# Patient Record
Sex: Male | Born: 1940 | Race: White | Hispanic: No | Marital: Married | Smoking: Former smoker
Health system: Southern US, Community
[De-identification: ages and names within clinical notes are randomized; demographics above are authoritative.]

## PROBLEM LIST (undated history)

## (undated) DIAGNOSIS — J986 Disorders of diaphragm: Secondary | ICD-10-CM

## (undated) DIAGNOSIS — T4145XA Adverse effect of unspecified anesthetic, initial encounter: Secondary | ICD-10-CM

## (undated) DIAGNOSIS — F419 Anxiety disorder, unspecified: Secondary | ICD-10-CM

## (undated) DIAGNOSIS — Z95810 Presence of automatic (implantable) cardiac defibrillator: Secondary | ICD-10-CM

## (undated) DIAGNOSIS — T8859XA Other complications of anesthesia, initial encounter: Secondary | ICD-10-CM

## (undated) DIAGNOSIS — W100XXA Fall (on)(from) escalator, initial encounter: Secondary | ICD-10-CM

## (undated) DIAGNOSIS — Z8739 Personal history of other diseases of the musculoskeletal system and connective tissue: Secondary | ICD-10-CM

## (undated) DIAGNOSIS — Z9981 Dependence on supplemental oxygen: Secondary | ICD-10-CM

## (undated) DIAGNOSIS — K259 Gastric ulcer, unspecified as acute or chronic, without hemorrhage or perforation: Secondary | ICD-10-CM

## (undated) DIAGNOSIS — E785 Hyperlipidemia, unspecified: Secondary | ICD-10-CM

## (undated) DIAGNOSIS — I428 Other cardiomyopathies: Secondary | ICD-10-CM

## (undated) DIAGNOSIS — I499 Cardiac arrhythmia, unspecified: Secondary | ICD-10-CM

## (undated) DIAGNOSIS — E039 Hypothyroidism, unspecified: Secondary | ICD-10-CM

## (undated) DIAGNOSIS — K219 Gastro-esophageal reflux disease without esophagitis: Secondary | ICD-10-CM

## (undated) DIAGNOSIS — M1990 Unspecified osteoarthritis, unspecified site: Secondary | ICD-10-CM

## (undated) DIAGNOSIS — I4891 Unspecified atrial fibrillation: Secondary | ICD-10-CM

## (undated) DIAGNOSIS — Z950 Presence of cardiac pacemaker: Secondary | ICD-10-CM

## (undated) DIAGNOSIS — Z9289 Personal history of other medical treatment: Secondary | ICD-10-CM

## (undated) DIAGNOSIS — Z8719 Personal history of other diseases of the digestive system: Secondary | ICD-10-CM

## (undated) DIAGNOSIS — I509 Heart failure, unspecified: Secondary | ICD-10-CM

## (undated) HISTORY — PX: ESOPHAGOGASTRODUODENOSCOPY: SHX1529

## (undated) HISTORY — PX: ATRIAL ABLATION SURGERY: SHX560

## (undated) HISTORY — PX: LAPAROSCOPIC CHOLECYSTECTOMY: SUR755

## (undated) HISTORY — PX: COLONOSCOPY: SHX174

## (undated) HISTORY — PX: TONSILLECTOMY: SUR1361

## (undated) HISTORY — PX: ESOPHAGOGASTRODUODENOSCOPY (EGD) WITH ESOPHAGEAL DILATION: SHX5812

## (undated) HISTORY — PX: CARDIAC CATHETERIZATION: SHX172

## (undated) HISTORY — PX: CATARACT EXTRACTION W/ INTRAOCULAR LENS  IMPLANT, BILATERAL: SHX1307

---

## 1973-10-29 HISTORY — PX: VASECTOMY: SHX75

## 1989-10-29 HISTORY — PX: INSERT / REPLACE / REMOVE PACEMAKER: SUR710

## 2001-10-29 HISTORY — PX: REPLACEMENT UNICONDYLAR JOINT KNEE: SUR1227

## 2013-10-29 HISTORY — PX: BI-VENTRICULAR IMPLANTABLE CARDIOVERTER DEFIBRILLATOR  (CRT-D): SHX5747

## 2014-05-29 DIAGNOSIS — Z9289 Personal history of other medical treatment: Secondary | ICD-10-CM

## 2014-05-29 HISTORY — DX: Personal history of other medical treatment: Z92.89

## 2017-11-06 ENCOUNTER — Observation Stay (HOSPITAL_COMMUNITY)
Admission: EM | Admit: 2017-11-06 | Discharge: 2017-11-08 | Disposition: A | Discharge status: Home / Self Care | Payer: Medicare HMO | Attending: Internal Medicine | Admitting: Internal Medicine

## 2017-11-06 ENCOUNTER — Encounter (HOSPITAL_COMMUNITY): Payer: Self-pay | Admitting: Internal Medicine

## 2017-11-06 ENCOUNTER — Emergency Department (HOSPITAL_COMMUNITY): Payer: Medicare HMO

## 2017-11-06 ENCOUNTER — Other Ambulatory Visit: Payer: Self-pay

## 2017-11-06 DIAGNOSIS — E039 Hypothyroidism, unspecified: Secondary | ICD-10-CM | POA: Diagnosis not present

## 2017-11-06 DIAGNOSIS — T07XXXA Unspecified multiple injuries, initial encounter: Secondary | ICD-10-CM | POA: Diagnosis not present

## 2017-11-06 DIAGNOSIS — S0181XA Laceration without foreign body of other part of head, initial encounter: Secondary | ICD-10-CM | POA: Insufficient documentation

## 2017-11-06 DIAGNOSIS — R079 Chest pain, unspecified: Secondary | ICD-10-CM | POA: Diagnosis not present

## 2017-11-06 DIAGNOSIS — Z7951 Long term (current) use of inhaled steroids: Secondary | ICD-10-CM | POA: Insufficient documentation

## 2017-11-06 DIAGNOSIS — W19XXXA Unspecified fall, initial encounter: Secondary | ICD-10-CM | POA: Diagnosis present

## 2017-11-06 DIAGNOSIS — Z79899 Other long term (current) drug therapy: Secondary | ICD-10-CM | POA: Insufficient documentation

## 2017-11-06 DIAGNOSIS — M25511 Pain in right shoulder: Secondary | ICD-10-CM | POA: Insufficient documentation

## 2017-11-06 DIAGNOSIS — I482 Chronic atrial fibrillation, unspecified: Secondary | ICD-10-CM | POA: Diagnosis present

## 2017-11-06 DIAGNOSIS — E785 Hyperlipidemia, unspecified: Secondary | ICD-10-CM | POA: Diagnosis not present

## 2017-11-06 DIAGNOSIS — R51 Headache: Secondary | ICD-10-CM | POA: Diagnosis present

## 2017-11-06 DIAGNOSIS — S22008A Other fracture of unspecified thoracic vertebra, initial encounter for closed fracture: Secondary | ICD-10-CM | POA: Insufficient documentation

## 2017-11-06 DIAGNOSIS — W100XXA Fall (on)(from) escalator, initial encounter: Secondary | ICD-10-CM | POA: Insufficient documentation

## 2017-11-06 DIAGNOSIS — M542 Cervicalgia: Secondary | ICD-10-CM | POA: Diagnosis not present

## 2017-11-06 DIAGNOSIS — S0281XA Fracture of other specified skull and facial bones, right side, initial encounter for closed fracture: Principal | ICD-10-CM | POA: Insufficient documentation

## 2017-11-06 DIAGNOSIS — Z7901 Long term (current) use of anticoagulants: Secondary | ICD-10-CM | POA: Insufficient documentation

## 2017-11-06 DIAGNOSIS — Z79890 Hormone replacement therapy (postmenopausal): Secondary | ICD-10-CM | POA: Insufficient documentation

## 2017-11-06 DIAGNOSIS — Z882 Allergy status to sulfonamides status: Secondary | ICD-10-CM | POA: Insufficient documentation

## 2017-11-06 DIAGNOSIS — I509 Heart failure, unspecified: Secondary | ICD-10-CM | POA: Diagnosis not present

## 2017-11-06 DIAGNOSIS — Z880 Allergy status to penicillin: Secondary | ICD-10-CM | POA: Insufficient documentation

## 2017-11-06 DIAGNOSIS — Z95810 Presence of automatic (implantable) cardiac defibrillator: Secondary | ICD-10-CM | POA: Insufficient documentation

## 2017-11-06 DIAGNOSIS — S02401A Maxillary fracture, unspecified, initial encounter for closed fracture: Secondary | ICD-10-CM | POA: Diagnosis not present

## 2017-11-06 DIAGNOSIS — S0990XA Unspecified injury of head, initial encounter: Secondary | ICD-10-CM

## 2017-11-06 DIAGNOSIS — I428 Other cardiomyopathies: Secondary | ICD-10-CM

## 2017-11-06 DIAGNOSIS — I429 Cardiomyopathy, unspecified: Secondary | ICD-10-CM | POA: Insufficient documentation

## 2017-11-06 HISTORY — DX: Other complications of anesthesia, initial encounter: T88.59XA

## 2017-11-06 HISTORY — DX: Fall (on)(from) escalator, initial encounter: W10.0XXA

## 2017-11-06 HISTORY — DX: Unspecified atrial fibrillation: I48.91

## 2017-11-06 HISTORY — DX: Personal history of other medical treatment: Z92.89

## 2017-11-06 HISTORY — DX: Anxiety disorder, unspecified: F41.9

## 2017-11-06 HISTORY — DX: Hypothyroidism, unspecified: E03.9

## 2017-11-06 HISTORY — DX: Other cardiomyopathies: I42.8

## 2017-11-06 HISTORY — DX: Presence of automatic (implantable) cardiac defibrillator: Z95.810

## 2017-11-06 HISTORY — DX: Unspecified osteoarthritis, unspecified site: M19.90

## 2017-11-06 HISTORY — DX: Heart failure, unspecified: I50.9

## 2017-11-06 HISTORY — DX: Gastric ulcer, unspecified as acute or chronic, without hemorrhage or perforation: K25.9

## 2017-11-06 HISTORY — DX: Personal history of other diseases of the digestive system: Z87.19

## 2017-11-06 HISTORY — DX: Dependence on supplemental oxygen: Z99.81

## 2017-11-06 HISTORY — DX: Disorders of diaphragm: J98.6

## 2017-11-06 HISTORY — DX: Cardiac arrhythmia, unspecified: I49.9

## 2017-11-06 HISTORY — DX: Personal history of other diseases of the musculoskeletal system and connective tissue: Z87.39

## 2017-11-06 HISTORY — DX: Adverse effect of unspecified anesthetic, initial encounter: T41.45XA

## 2017-11-06 HISTORY — DX: Gastro-esophageal reflux disease without esophagitis: K21.9

## 2017-11-06 HISTORY — DX: Presence of cardiac pacemaker: Z95.0

## 2017-11-06 HISTORY — DX: Hyperlipidemia, unspecified: E78.5

## 2017-11-06 LAB — PROTIME-INR
INR: 2.41
Prothrombin Time: 26 seconds — ABNORMAL HIGH (ref 11.4–15.2)

## 2017-11-06 LAB — COMPREHENSIVE METABOLIC PANEL
ALK PHOS: 72 U/L (ref 38–126)
ALT: 24 U/L (ref 17–63)
AST: 33 U/L (ref 15–41)
Albumin: 3.6 g/dL (ref 3.5–5.0)
Anion gap: 6 (ref 5–15)
BILIRUBIN TOTAL: 0.8 mg/dL (ref 0.3–1.2)
BUN: 20 mg/dL (ref 6–20)
CO2: 29 mmol/L (ref 22–32)
CREATININE: 1.19 mg/dL (ref 0.61–1.24)
Calcium: 8.8 mg/dL — ABNORMAL LOW (ref 8.9–10.3)
Chloride: 103 mmol/L (ref 101–111)
GFR, EST NON AFRICAN AMERICAN: 57 mL/min — AB (ref >60)
Glucose, Bld: 96 mg/dL (ref 65–99)
Potassium: 4.1 mmol/L (ref 3.5–5.1)
Sodium: 138 mmol/L (ref 135–145)
Total Protein: 6.6 g/dL (ref 6.5–8.1)

## 2017-11-06 LAB — CBC
HEMATOCRIT: 37.7 % — AB (ref 39.0–52.0)
HEMOGLOBIN: 11.8 g/dL — AB (ref 13.0–17.0)
MCH: 29.3 pg (ref 26.0–34.0)
MCHC: 31.3 g/dL (ref 30.0–36.0)
MCV: 93.5 fL (ref 78.0–100.0)
PLATELETS: 139 10*3/uL — AB (ref 150–400)
RBC: 4.03 MIL/uL — AB (ref 4.22–5.81)
RDW: 14.4 % (ref 11.5–15.5)
WBC: 6 10*3/uL (ref 4.0–10.5)

## 2017-11-06 LAB — URINALYSIS, ROUTINE W REFLEX MICROSCOPIC
Bacteria, UA: NONE SEEN
Bilirubin Urine: NEGATIVE
Glucose, UA: NEGATIVE mg/dL
Ketones, ur: 5 mg/dL — AB
Leukocytes, UA: NEGATIVE
NITRITE: NEGATIVE
Protein, ur: NEGATIVE mg/dL
SPECIFIC GRAVITY, URINE: 1.014 (ref 1.005–1.030)
Squamous Epithelial / LPF: NONE SEEN
pH: 5 (ref 5.0–8.0)

## 2017-11-06 MED ORDER — AMIODARONE HCL 200 MG PO TABS
200.0000 mg | ORAL_TABLET | Freq: Once | ORAL | Status: AC
  Administered 2017-11-06: 200 mg via ORAL
  Filled 2017-11-06: qty 1

## 2017-11-06 MED ORDER — PANTOPRAZOLE SODIUM 40 MG PO TBEC
40.0000 mg | DELAYED_RELEASE_TABLET | Freq: Two times a day (BID) | ORAL | Status: DC
  Administered 2017-11-07 – 2017-11-08 (×3): 40 mg via ORAL
  Filled 2017-11-06 (×3): qty 1

## 2017-11-06 MED ORDER — METOPROLOL SUCCINATE ER 100 MG PO TB24
100.0000 mg | ORAL_TABLET | Freq: Every evening | ORAL | Status: DC
  Administered 2017-11-07: 100 mg via ORAL
  Filled 2017-11-06 (×2): qty 1

## 2017-11-06 MED ORDER — ONDANSETRON HCL 4 MG/2ML IJ SOLN: 4.0000 mg | Freq: Four times a day (QID) | INTRAMUSCULAR | Status: DC | PRN

## 2017-11-06 MED ORDER — FAMOTIDINE 20 MG PO TABS
20.0000 mg | ORAL_TABLET | Freq: Two times a day (BID) | ORAL | Status: DC
  Administered 2017-11-07 – 2017-11-08 (×3): 20 mg via ORAL
  Filled 2017-11-06 (×3): qty 1

## 2017-11-06 MED ORDER — ONDANSETRON HCL 4 MG PO TABS: 4.0000 mg | ORAL_TABLET | Freq: Four times a day (QID) | ORAL | Status: DC | PRN

## 2017-11-06 MED ORDER — ACETAMINOPHEN 650 MG RE SUPP: 650.0000 mg | Freq: Four times a day (QID) | RECTAL | Status: DC | PRN

## 2017-11-06 MED ORDER — METOPROLOL TARTRATE 25 MG PO TABS
50.0000 mg | ORAL_TABLET | Freq: Once | ORAL | Status: AC
  Administered 2017-11-06: 50 mg via ORAL
  Filled 2017-11-06: qty 2

## 2017-11-06 MED ORDER — FENTANYL CITRATE (PF) 100 MCG/2ML IJ SOLN
50.0000 ug | Freq: Once | INTRAMUSCULAR | Status: AC
Start: 2017-11-06 — End: 2017-11-06
  Administered 2017-11-06: 50 ug via INTRAVENOUS

## 2017-11-06 MED ORDER — TETANUS-DIPHTH-ACELL PERTUSSIS 5-2.5-18.5 LF-MCG/0.5 IM SUSP
0.5000 mL | Freq: Once | INTRAMUSCULAR | Status: AC
  Administered 2017-11-06: 0.5 mL via INTRAMUSCULAR
  Filled 2017-11-06: qty 0.5

## 2017-11-06 MED ORDER — FENTANYL CITRATE (PF) 100 MCG/2ML IJ SOLN
50.0000 ug | Freq: Once | INTRAMUSCULAR | Status: AC
  Administered 2017-11-06: 50 ug via INTRAVENOUS
  Filled 2017-11-06: qty 2

## 2017-11-06 MED ORDER — FENTANYL CITRATE (PF) 100 MCG/2ML IJ SOLN
25.0000 ug | INTRAMUSCULAR | Status: DC | PRN
  Administered 2017-11-07 (×2): 25 ug via INTRAVENOUS
  Filled 2017-11-06 (×2): qty 2

## 2017-11-06 MED ORDER — LORAZEPAM 1 MG PO TABS
2.0000 mg | ORAL_TABLET | Freq: Every day | ORAL | Status: DC
  Administered 2017-11-07 (×2): 2 mg via ORAL
  Filled 2017-11-06 (×2): qty 2

## 2017-11-06 MED ORDER — ESCITALOPRAM OXALATE 10 MG PO TABS
5.0000 mg | ORAL_TABLET | Freq: Every day | ORAL | Status: DC
  Administered 2017-11-07 – 2017-11-08 (×2): 5 mg via ORAL
  Filled 2017-11-06 (×2): qty 1

## 2017-11-06 MED ORDER — AMIODARONE HCL 200 MG PO TABS
200.0000 mg | ORAL_TABLET | Freq: Every evening | ORAL | Status: DC
  Administered 2017-11-07: 200 mg via ORAL
  Filled 2017-11-06: qty 1

## 2017-11-06 MED ORDER — BRIMONIDINE TARTRATE 0.15 % OP SOLN
1.0000 [drp] | Freq: Three times a day (TID) | OPHTHALMIC | Status: DC
  Administered 2017-11-07 – 2017-11-08 (×3): 1 [drp] via OPHTHALMIC
  Filled 2017-11-06 (×2): qty 5

## 2017-11-06 MED ORDER — FENTANYL CITRATE (PF) 100 MCG/2ML IJ SOLN
50.0000 ug | Freq: Once | INTRAMUSCULAR | Status: DC
  Filled 2017-11-06: qty 2

## 2017-11-06 MED ORDER — LEVOTHYROXINE SODIUM 100 MCG PO TABS
100.0000 ug | ORAL_TABLET | Freq: Every day | ORAL | Status: DC
  Administered 2017-11-07 – 2017-11-08 (×2): 100 ug via ORAL
  Filled 2017-11-06 (×2): qty 1

## 2017-11-06 MED ORDER — VITAMIN D 1000 UNITS PO TABS
2000.0000 [IU] | ORAL_TABLET | Freq: Two times a day (BID) | ORAL | Status: DC
  Administered 2017-11-07 – 2017-11-08 (×3): 2000 [IU] via ORAL
  Filled 2017-11-06 (×3): qty 2

## 2017-11-06 MED ORDER — SIMVASTATIN 10 MG PO TABS
10.0000 mg | ORAL_TABLET | Freq: Every day | ORAL | Status: DC
  Administered 2017-11-07: 10 mg via ORAL
  Filled 2017-11-06: qty 1

## 2017-11-06 MED ORDER — ACETAMINOPHEN 325 MG PO TABS
650.0000 mg | ORAL_TABLET | Freq: Four times a day (QID) | ORAL | Status: DC | PRN
  Administered 2017-11-07: 650 mg via ORAL
  Filled 2017-11-06: qty 2

## 2017-11-06 NOTE — ED Notes (Signed)
Small C collar applied

## 2017-11-06 NOTE — H&P (Signed)
History and Physical    Devon Jennings ZOX:[id number] DOB: [birth year] DOA: 11/06/2017  PCP: Rubbie Battiest, MD  [place]  Chief Complaint: Fall.  HPI: Devon Jennings is a 77 y.o. male with history of atrial fibrillation, nonischemic cardiomyopathy status post ICD placement, hypothyroidism, hyperlipidemia had a fall while getting [place] escal[place] the airport.  Patient states he may have lost consciousness not but not sure.  Since the fall patient has been having facial bruise with pain and also extremity pain.  Denies any chest pain abdominal pain nausea vomiting.  ED Course: In the ER patient had CT of the head and C-spine which shows right sided multiple fractures of the facial bones and also possible fracture of the T2 spinous process.  Patient's INR is 2.4.  On-call trauma surgeon Dr. Lindie Spruce has been consulted.  Patient admitted for observation.  Review of Systems: As per HPI, rest all negative.   Past Medical History:  Diagnosis Date  . CHF (congestive heart failure) (HCC)   . Dysrhythmia   . Hypothyroidism     Past Surgical History:  Procedure Laterality Date  . CARDIAC DEFIBRILLATOR PLACEMENT    . CHOLECYSTECTOMY       reports that  has never smoked. he has never used smokeless tobacco. He reports that he does not drink alcohol or use drugs.  Allergies  Allergen Reactions  . Penicillins Hives  . Sulfa Antibiotics Rash    Family History  Problem Relation Age of Onset  . Hypertension Other     Prior to Admission medications   Medication Sig Start Date End Date Taking? Authorizing Provider  amiodarone (PACERONE) 200 MG tablet Take 200 mg by mouth every evening.   Yes [provider]  brimonidine (ALPHAGAN) 0.15 % ophthalmic solution Place 1 drop into the left eye 3 (three) times daily.   Yes [provider]  Cholecalciferol (VITAMIN D3) 2000 units capsule Take 2,000 Units by mouth 2 (two) times daily.   Yes [provider]  escitalopram (LEXAPRO) 5 MG tablet Take 5 mg by mouth daily.   Yes [provider]  fluticasone (FLONASE) 50 MCG/ACT nasal spray Place 1 spray into the nose daily as needed for allergies.    Yes [provider]  hydroxypropyl methylcellulose / hypromellose (ISOPTO TEARS / GONIOVISC) 2.5 % ophthalmic solution Place 1 drop into the right eye 3 (three) times daily.   Yes [provider]  levothyroxine (SYNTHROID, LEVOTHROID) 100 MCG tablet Take 100 mcg by mouth daily.   Yes [provider]  LORazepam (ATIVAN) 1 MG tablet Take 2 mg by mouth at bedtime.   Yes [provider]  metoprolol succinate (TOPROL-XL) 100 MG 24 hr tablet Take 100 mg by mouth every evening.   Yes [provider]  mometasone (ASMANEX) 220 MCG/INH inhaler Inhale 2 puffs into the lungs at bedtime.   Yes [provider]  pantoprazole (PROTONIX) 40 MG tablet Take 40 mg by mouth 2 (two) times daily. 10/09/17  Yes [provider]  ranitidine (ZANTAC) 300 MG tablet Take 300 mg by mouth at bedtime.   Yes [provider]  simvastatin (ZOCOR) 10 MG tablet Take 10 mg by mouth at bedtime.   Yes [provider]  tiotropium (SPIRIVA) 18 MCG inhalation capsule Place 18 mcg into inhaler and inhale at bedtime.    Yes [provider]  warfarin (COUMADIN) 3 MG tablet Take 1.5-3 mg by mouth See admin instructions. Take 1 tablet  on Sunday and Tuesday then take 1/2 tablet all the other days   Yes [provider]    Physical Exam: Vitals:   11/06/17 1915 11/06/17 1943 11/06/17 2000 11/06/17 2247  BP: 129/76 132/85 129/81 123/72  Pulse: 69 70 70 70  Resp: 15 19 14    Temp:      TempSrc:      SpO2: 98% 100% 100%   Weight:      Height:          Constitutional: Moderately built and nourished. Vitals:   11/06/17 1915 11/06/17 1943 11/06/17 2000 11/06/17 2247  BP: 129/76 132/85 129/81 123/72  Pulse: 69 70 70 70  Resp: 15  19 14    Temp:      TempSrc:      SpO2: 98% 100% 100%   Weight:      Height:       Eyes: Anicteric no pallor. ENMT: Right maxillary bruising. Neck: No neck rigidity. Respiratory: No rhonchi or crepitations. Cardiovascular: S1-S2 heard. Abdomen: Soft nontender bowel sounds present. Musculoskeletal: Multiple bruises. Skin: Multiple bruises. Neurologic: Alert awake oriented to time place and person.  Moves all extremities 5 x 5. Psychiatric: Appears normal.  Normal affect.   Labs on Admission: I have personally reviewed following labs and imaging studies  CBC: Recent Labs  Lab 11/06/17 1839  WBC 6.0  HGB 11.8*  HCT 37.7*  MCV 93.5  PLT 139*   Basic Metabolic Panel: Recent Labs  Lab 11/06/17 1839  NA 138  K 4.1  CL 103  CO2 29  GLUCOSE 96  BUN 20  CREATININE 1.19  CALCIUM 8.8*   GFR: Estimated Creatinine Clearance: 69 mL/min (by C-G formula based on SCr of 1.19 mg/dL). Liver Function Tests: Recent Labs  Lab 11/06/17 1839  AST 33  ALT 24  ALKPHOS 72  BILITOT 0.8  PROT 6.6  ALBUMIN 3.6   No results for input(s): LIPASE, AMYLASE in the last 168 hours. No results for input(s): AMMONIA in the last 168 hours. Coagulation Profile: Recent Labs  Lab 11/06/17 1839  INR 2.41   Cardiac Enzymes: No results for input(s): CKTOTAL, CKMB, CKMBINDEX, TROPONINI in the last 168 hours. BNP (last 3 results) No results for input(s): PROBNP in the last 8760 hours. HbA1C: No results for input(s): HGBA1C in the last 72 hours. CBG: No results for input(s): GLUCAP in the last 168 hours. Lipid Profile: No results for input(s): CHOL, HDL, LDLCALC, TRIG, CHOLHDL, LDLDIRECT in the last 72 hours. Thyroid Function Tests: No results for input(s): TSH, T4TOTAL, FREET4, T3FREE, THYROIDAB in the last 72 hours. Anemia Panel: No results for input(s): VITAMINB12, FOLATE, FERRITIN, TIBC, IRON, RETICCTPCT in the last 72 hours. Urine analysis:    Component Value Date/Time    COLORURINE YELLOW 11/06/2017 2116   APPEARANCEUR CLEAR 11/06/2017 2116   LABSPEC 1.014 11/06/2017 2116   PHURINE 5.0 11/06/2017 2116   GLUCOSEU NEGATIVE 11/06/2017 2116   HGBUR LARGE (A) 11/06/2017 2116   BILIRUBINUR NEGATIVE 11/06/2017 2116   KETONESUR 5 (A) 11/06/2017 2116   PROTEINUR NEGATIVE 11/06/2017 2116   NITRITE NEGATIVE 11/06/2017 2116   LEUKOCYTESUR NEGATIVE 11/06/2017 2116   Sepsis Labs: @LABRCNTIP (procalcitonin:4,lacticidven:4) )No results found for this or any previous visit (from the past 240 hour(s)).   Radiological Exams on Admission: Dg Shoulder Right  Result Date: 11/06/2017 CLINICAL DATA:  Right shoulder pain since falling down escalator today. EXAM: RIGHT SHOULDER - 2+ VIEW COMPARISON:  None. FINDINGS: AP and Y-views were obtained. Patient could not  tolerate an axillary view. No evidence of acute fracture or dislocation. The subacromial space is preserved. There are mild acromioclavicular and glenohumeral degenerative changes. Patient has an AICD. IMPRESSION: No acute osseous findings. Electronically Signed   By: Carey Bullocks M.D.   On: 11/06/2017 21:02   Ct Head Wo Contrast  Result Date: 11/06/2017 CLINICAL DATA:  Right-sided head and neck pain after falling down escalator today. Loss of consciousness. EXAM: CT HEAD WITHOUT CONTRAST CT CERVICAL SPINE WITHOUT CONTRAST TECHNIQUE: Multidetector CT imaging of the head and cervical spine was performed following the standard protocol without intravenous contrast. Multiplanar CT image reconstructions of the cervical spine were also generated. COMPARISON:  None. FINDINGS: CT HEAD FINDINGS Brain: There is no evidence of acute intracranial hemorrhage, mass lesion, brain edema or extra-axial fluid collection. The ventricles and subarachnoid spaces are appropriately sized for age. There is no CT evidence of acute cortical infarction. Vascular:  No hyperdense vessel identified. Skull: Negative for fracture or focal lesion.  Sinuses/Orbits: There is right periorbital soft tissue swelling associated with multiple acute right-sided facial fractures. There is a depressed and mildly comminuted fracture of the right zygomatic arch. There are fractures of the right orbital floor and lateral wall. There are fractures of the anterior and lateral walls the right maxilla. No left-sided facial fractures are seen. The globes are intact. There is some soft tissue emphysema in the right malar region. There is mucosal thickening and/or blood in the ethmoid and right maxillary and frontal sinuses. Other: None. CT CERVICAL SPINE FINDINGS Alignment: 2 mm of retrolisthesis at C3-4.  Otherwise normal. Skull base and vertebrae: No evidence of acute cervical spine fracture or traumatic subluxation. However, there is a possible nondisplaced fracture of the T2 spinous process. There is multilevel cervical spondylosis with ankylosis across the C2-3 disc and facet joints. Moderate endplate degenerative changes are present at C3-4. Soft tissues and spinal canal: No prevertebral fluid or swelling. No visible canal hematoma. Disc levels: As above, multilevel spondylosis. There is severe right foraminal narrowing at C3-4 secondary to uncinate spurring and facet hypertrophy. There mild-to-moderate left greater than right foraminal narrowing at C4-5 and C5-6. Upper chest: No acute findings. Other: Carotid atherosclerosis. IMPRESSION: 1. No evidence of acute intracranial or calvarial injury. 2. Multiple right-sided facial fractures (tripod fracture) as described. No evidence of orbital hematoma. There blood in the paranasal sinuses. 3. No evidence of acute cervical spine fracture, traumatic subluxation or static signs of instability. Possible nondisplaced fracture of the T2 spinous process. 4. Multilevel cervical spondylosis. Electronically Signed   By: Carey Bullocks M.D.   On: 11/06/2017 19:46   Ct Cervical Spine Wo Contrast  Result Date: 11/06/2017 CLINICAL  DATA:  Right-sided head and neck pain after falling down escalator today. Loss of consciousness. EXAM: CT HEAD WITHOUT CONTRAST CT CERVICAL SPINE WITHOUT CONTRAST TECHNIQUE: Multidetector CT imaging of the head and cervical spine was performed following the standard protocol without intravenous contrast. Multiplanar CT image reconstructions of the cervical spine were also generated. COMPARISON:  None. FINDINGS: CT HEAD FINDINGS Brain: There is no evidence of acute intracranial hemorrhage, mass lesion, brain edema or extra-axial fluid collection. The ventricles and subarachnoid spaces are appropriately sized for age. There is no CT evidence of acute cortical infarction. Vascular:  No hyperdense vessel identified. Skull: Negative for fracture or focal lesion. Sinuses/Orbits: There is right periorbital soft tissue swelling associated with multiple acute right-sided facial fractures. There is a depressed and mildly comminuted fracture of the right zygomatic arch. There  are fractures of the right orbital floor and lateral wall. There are fractures of the anterior and lateral walls the right maxilla. No left-sided facial fractures are seen. The globes are intact. There is some soft tissue emphysema in the right malar region. There is mucosal thickening and/or blood in the ethmoid and right maxillary and frontal sinuses. Other: None. CT CERVICAL SPINE FINDINGS Alignment: 2 mm of retrolisthesis at C3-4.  Otherwise normal. Skull base and vertebrae: No evidence of acute cervical spine fracture or traumatic subluxation. However, there is a possible nondisplaced fracture of the T2 spinous process. There is multilevel cervical spondylosis with ankylosis across the C2-3 disc and facet joints. Moderate endplate degenerative changes are present at C3-4. Soft tissues and spinal canal: No prevertebral fluid or swelling. No visible canal hematoma. Disc levels: As above, multilevel spondylosis. There is severe right foraminal narrowing  at C3-4 secondary to uncinate spurring and facet hypertrophy. There mild-to-moderate left greater than right foraminal narrowing at C4-5 and C5-6. Upper chest: No acute findings. Other: Carotid atherosclerosis. IMPRESSION: 1. No evidence of acute intracranial or calvarial injury. 2. Multiple right-sided facial fractures (tripod fracture) as described. No evidence of orbital hematoma. There blood in the paranasal sinuses. 3. No evidence of acute cervical spine fracture, traumatic subluxation or static signs of instability. Possible nondisplaced fracture of the T2 spinous process. 4. Multilevel cervical spondylosis. Electronically Signed   By: Carey Bullocks M.D.   On: 11/06/2017 19:46   Dg Chest Portable 1 View  Result Date: 11/06/2017 CLINICAL DATA:  Right-sided chest pain after falling down escalator steps today. Initial encounter. EXAM: PORTABLE CHEST 1 VIEW COMPARISON:  None. FINDINGS: 1841 hr. Left subclavian AICD with multiple leads extending into the right atrium and right ventricle. Some of these are likely remnants of previous pacemakers. The heart size is normal. There is mild aortic and great vessel tortuosity. There is elevation of the right hemidiaphragm with mild right basilar atelectasis. No pneumothorax or significant pleural effusion identified. No acute rib fractures are seen. IMPRESSION: No definite acute findings. Elevated right hemidiaphragm with right basilar atelectasis. Electronically Signed   By: Carey Bullocks M.D.   On: 11/06/2017 19:03    EKG: Independently reviewed.  Normal sinus rhythm with LBBB.  Assessment/Plan Active Problems:   Fall   Atrial fibrillation, chronic (HCC)   NICM (nonischemic cardiomyopathy) (HCC)   Hypothyroidism    1. Fall with facial fractures and possible T2 spinous process fracture -CT of the maxillofacial has been ordered.  Dr. Lazarus Salines of ENT will be seeing patient in consult.  Appreciate trauma surgery consult.  Patient on pain  medications. 2. Atrial fibrillation rate controlled presently in sinus rhythm -on amiodarone and metoprolol.  Coumadin on hold secondary to trauma. 3. Nonischemic cardiomyopathy status post AICD placement. 4. History of hypothyroidism on Synthroid. 5. Normocytic normochromic anemia and thrombocytopenia -follow CBC.   DVT prophylaxis: SCDs and patient is also on Coumadin on hold. Code Status: Full code. Family Communication: Discussed with wife. Disposition Plan: Home. Consults called: Trauma surgery. Admission status: Observation.   Eduard Clos MD [facility] Pager [phone].  If 7PM-7AM, please contact night-coverage [email or web address] Password [facility]  11/06/2017, 11:33 PM

## 2017-11-06 NOTE — ED Triage Notes (Signed)
[place] the [place]. Pt was at the top pf the escalator when he fell down 2/3 of the way down.  He did lose consciousness. Pt presents with pain to his right arm, head, and face with several lacerations. Currently a&ox4. Pt on coumadin for afib.

## 2017-11-06 NOTE — Consult Note (Addendum)
Reason for Consult:Fall down escalator with superficial skin trauma and facial fracture Referring Physician: Avrian Delfavero is an 77 y.o. male.  HPI: This patient [place] the airport [place] returning from a trip to [place].  No LOC.  On coumadin for Afib, and has history of CHF, ventricular tachycardia.  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   No family history on file.  Social History:  has no tobacco, alcohol, and drug history on file.  Allergies:  Allergies  Allergen Reactions  . Penicillins Hives  . Sulfa Antibiotics Rash    Medications: I have reviewed the patient's current medications.  Results for orders placed or performed during the hospital encounter of 11/06/17 (from the past 48 hour(s))  Protime-INR     Status: Abnormal   Collection Time: 11/06/17  6:39 PM  Result Value Ref Range   Prothrombin Time 26.0 (H) 11.4 - 15.2 seconds   INR 2.41   CBC     Status: Abnormal   Collection Time: 11/06/17  6:39 PM  Result Value Ref Range   WBC 6.0 4.0 - 10.5 K/uL   RBC 4.03 (L) 4.22 - 5.81 MIL/uL   Hemoglobin 11.8 (L) 13.0 - 17.0 g/dL   HCT 37.7 (L) 39.0 - 52.0 %   MCV 93.5 78.0 - 100.0 fL   MCH 29.3 26.0 - 34.0 pg   MCHC 31.3 30.0 - 36.0 g/dL   RDW 14.4 11.5 - 15.5 %   Platelets 139 (L) 150 - 400 K/uL  Comprehensive metabolic panel     Status: Abnormal   Collection Time: 11/06/17  6:39 PM  Result Value Ref Range   Sodium 138 135 - 145 mmol/L   Potassium 4.1 3.5 - 5.1 mmol/L   Chloride 103 101 - 111 mmol/L   CO2 29 22 - 32 mmol/L   Glucose, Bld 96 65 - 99 mg/dL   BUN 20 6 - 20 mg/dL   Creatinine, Ser 1.19 0.61 - 1.24 mg/dL   Calcium 8.8 (L) 8.9 - 10.3 mg/dL   Total Protein 6.6 6.5 - 8.1 g/dL   Albumin 3.6 3.5 - 5.0 g/dL   AST 33 15 - 41 U/L   ALT 24 17 - 63 U/L   Alkaline Phosphatase 72 38 - 126 U/L   Total Bilirubin 0.8 0.3 - 1.2 mg/dL   GFR calc non Af Amer 57 (L) >60 mL/min   GFR calc Af Amer >60 >60 mL/min     Comment: (NOTE) The eGFR has been calculated using the CKD EPI equation. This calculation has not been validated in all clinical situations. eGFR's persistently <60 mL/min signify possible Chronic Kidney Disease.    Anion gap 6 5 - 15  Urinalysis, Routine w reflex microscopic     Status: Abnormal   Collection Time: 11/06/17  9:16 PM  Result Value Ref Range   Color, Urine YELLOW YELLOW   APPearance CLEAR CLEAR   Specific Gravity, Urine 1.014 1.005 - 1.030   pH 5.0 5.0 - 8.0   Glucose, UA NEGATIVE NEGATIVE mg/dL   Hgb urine dipstick LARGE (A) NEGATIVE   Bilirubin Urine NEGATIVE NEGATIVE   Ketones, ur 5 (A) NEGATIVE mg/dL   Protein, ur NEGATIVE NEGATIVE mg/dL   Nitrite NEGATIVE NEGATIVE   Leukocytes, UA NEGATIVE NEGATIVE   RBC / HPF 6-30 0 - 5 RBC/hpf   WBC, UA 0-5 0 - 5 WBC/hpf   Bacteria, UA NONE SEEN NONE SEEN  Squamous Epithelial / LPF NONE SEEN NONE SEEN   Mucus PRESENT     Dg Shoulder Right  Result Date: 11/06/2017 CLINICAL DATA:  Right shoulder pain since falling down escalator today. EXAM: RIGHT SHOULDER - 2+ VIEW COMPARISON:  None. FINDINGS: AP and Y-views were obtained. Patient could not tolerate an axillary view. No evidence of acute fracture or dislocation. The subacromial space is preserved. There are mild acromioclavicular and glenohumeral degenerative changes. Patient has an AICD. IMPRESSION: No acute osseous findings. Electronically Signed   By: Richardean Sale M.D.   On: 11/06/2017 21:02   Ct Head Wo Contrast  Result Date: 11/06/2017 CLINICAL DATA:  Right-sided head and neck pain after falling down escalator today. Loss of consciousness. EXAM: CT HEAD WITHOUT CONTRAST CT CERVICAL SPINE WITHOUT CONTRAST TECHNIQUE: Multidetector CT imaging of the head and cervical spine was performed following the standard protocol without intravenous contrast. Multiplanar CT image reconstructions of the cervical spine were also generated. COMPARISON:  None. FINDINGS: CT HEAD FINDINGS  Brain: There is no evidence of acute intracranial hemorrhage, mass lesion, brain edema or extra-axial fluid collection. The ventricles and subarachnoid spaces are appropriately sized for age. There is no CT evidence of acute cortical infarction. Vascular:  No hyperdense vessel identified. Skull: Negative for fracture or focal lesion. Sinuses/Orbits: There is right periorbital soft tissue swelling associated with multiple acute right-sided facial fractures. There is a depressed and mildly comminuted fracture of the right zygomatic arch. There are fractures of the right orbital floor and lateral wall. There are fractures of the anterior and lateral walls the right maxilla. No left-sided facial fractures are seen. The globes are intact. There is some soft tissue emphysema in the right malar region. There is mucosal thickening and/or blood in the ethmoid and right maxillary and frontal sinuses. Other: None. CT CERVICAL SPINE FINDINGS Alignment: 2 mm of retrolisthesis at C3-4.  Otherwise normal. Skull base and vertebrae: No evidence of acute cervical spine fracture or traumatic subluxation. However, there is a possible nondisplaced fracture of the T2 spinous process. There is multilevel cervical spondylosis with ankylosis across the C2-3 disc and facet joints. Moderate endplate degenerative changes are present at C3-4. Soft tissues and spinal canal: No prevertebral fluid or swelling. No visible canal hematoma. Disc levels: As above, multilevel spondylosis. There is severe right foraminal narrowing at C3-4 secondary to uncinate spurring and facet hypertrophy. There mild-to-moderate left greater than right foraminal narrowing at C4-5 and C5-6. Upper chest: No acute findings. Other: Carotid atherosclerosis. IMPRESSION: 1. No evidence of acute intracranial or calvarial injury. 2. Multiple right-sided facial fractures (tripod fracture) as described. No evidence of orbital hematoma. There blood in the paranasal sinuses. 3. No  evidence of acute cervical spine fracture, traumatic subluxation or static signs of instability. Possible nondisplaced fracture of the T2 spinous process. 4. Multilevel cervical spondylosis. Electronically Signed   By: Richardean Sale M.D.   On: 11/06/2017 19:46   Ct Cervical Spine Wo Contrast  Result Date: 11/06/2017 CLINICAL DATA:  Right-sided head and neck pain after falling down escalator today. Loss of consciousness. EXAM: CT HEAD WITHOUT CONTRAST CT CERVICAL SPINE WITHOUT CONTRAST TECHNIQUE: Multidetector CT imaging of the head and cervical spine was performed following the standard protocol without intravenous contrast. Multiplanar CT image reconstructions of the cervical spine were also generated. COMPARISON:  None. FINDINGS: CT HEAD FINDINGS Brain: There is no evidence of acute intracranial hemorrhage, mass lesion, brain edema or extra-axial fluid collection. The ventricles and subarachnoid spaces are appropriately sized for  age. There is no CT evidence of acute cortical infarction. Vascular:  No hyperdense vessel identified. Skull: Negative for fracture or focal lesion. Sinuses/Orbits: There is right periorbital soft tissue swelling associated with multiple acute right-sided facial fractures. There is a depressed and mildly comminuted fracture of the right zygomatic arch. There are fractures of the right orbital floor and lateral wall. There are fractures of the anterior and lateral walls the right maxilla. No left-sided facial fractures are seen. The globes are intact. There is some soft tissue emphysema in the right malar region. There is mucosal thickening and/or blood in the ethmoid and right maxillary and frontal sinuses. Other: None. CT CERVICAL SPINE FINDINGS Alignment: 2 mm of retrolisthesis at C3-4.  Otherwise normal. Skull base and vertebrae: No evidence of acute cervical spine fracture or traumatic subluxation. However, there is a possible nondisplaced fracture of the T2 spinous process.  There is multilevel cervical spondylosis with ankylosis across the C2-3 disc and facet joints. Moderate endplate degenerative changes are present at C3-4. Soft tissues and spinal canal: No prevertebral fluid or swelling. No visible canal hematoma. Disc levels: As above, multilevel spondylosis. There is severe right foraminal narrowing at C3-4 secondary to uncinate spurring and facet hypertrophy. There mild-to-moderate left greater than right foraminal narrowing at C4-5 and C5-6. Upper chest: No acute findings. Other: Carotid atherosclerosis. IMPRESSION: 1. No evidence of acute intracranial or calvarial injury. 2. Multiple right-sided facial fractures (tripod fracture) as described. No evidence of orbital hematoma. There blood in the paranasal sinuses. 3. No evidence of acute cervical spine fracture, traumatic subluxation or static signs of instability. Possible nondisplaced fracture of the T2 spinous process. 4. Multilevel cervical spondylosis. Electronically Signed   By: Richardean Sale M.D.   On: 11/06/2017 19:46   Dg Chest Portable 1 View  Result Date: 11/06/2017 CLINICAL DATA:  Right-sided chest pain after falling down escalator steps today. Initial encounter. EXAM: PORTABLE CHEST 1 VIEW COMPARISON:  None. FINDINGS: 1841 hr. Left subclavian AICD with multiple leads extending into the right atrium and right ventricle. Some of these are likely remnants of previous pacemakers. The heart size is normal. There is mild aortic and great vessel tortuosity. There is elevation of the right hemidiaphragm with mild right basilar atelectasis. No pneumothorax or significant pleural effusion identified. No acute rib fractures are seen. IMPRESSION: No definite acute findings. Elevated right hemidiaphragm with right basilar atelectasis. Electronically Signed   By: Richardean Sale M.D.   On: 11/06/2017 19:03    Review of Systems  Constitutional: Negative.   Eyes: Positive for pain (right periorbital pain.).    Cardiovascular: Negative for chest pain.   Blood pressure 129/81, pulse 70, temperature 97.6 F (36.4 C), temperature source Temporal, resp. rate 14, height 6' 4"  (1.93 m), weight 104.3 kg (230 lb), SpO2 100 %. Physical Exam  Constitutional: He is oriented to person, place, and time. He appears well-developed and well-nourished.  HENT:  Head: Normocephalic.    Eyes: EOM are normal. Pupils are equal, round, and reactive to light.  No right eye entrapment  Neck: Normal range of motion. Neck supple.  Cardiovascular: Normal rate and regular rhythm.  Does not appear to be in Afib currently  Respiratory: Effort normal and breath sounds normal.  GI: Soft. Bowel sounds are normal.  Neurological: He is alert and oriented to person, place, and time. He has normal reflexes.  Skin: Skin is warm and dry.  Psychiatric: He has a normal mood and affect. His behavior is normal. Judgment and  thought content normal.    Assessment/Plan: Fall down escalator with right facial fractures, to be addressed by Dr. Erik Obey in the AM No other injuries. Patient is on coumadin, and INR is therapeutic.  He has no intracranial hemorrhage.  I do not believe that his coumadin needs to be actively reversed, but I would not give him his anticoagulant  Possible nondisplaced T-2 spinous process fracture--no further workup necessary  There is little for the trauma service to offer in this gentleman with MMP including significant heart disease.  We will consult as will Dr. Erik Obey, but needs primary care/hospitalist admission.  We will continue to follow.  Patient takes amiodarone and Lopressor in the evening and I will order those doses.  Judeth Horn 11/06/2017, 10:23 PM

## 2017-11-06 NOTE — ED Provider Notes (Signed)
[facility] EMERGENCY DEPARTMENT Provider Note   CSN: [id number] Arrival date & time: 11/06/17  1824     History   Chief Complaint Chief Complaint  Patient presents with  . Fall  . Head Injury    HPI Devon Jennings is a 77 y.o. male.  HPI Patient with history of atrial fibrillation on Coumadin states he fell down escalator.  No loss of consciousness.  Sustained multiple superficial lacerations to the scalp and face as well as the right lower extremity.  He complains of right-sided facial pain, neck pain and right shoulder pain.  No focal weakness or numbness. No past medical history on file.  Patient Active Problem List   Diagnosis Date Noted  . Fall 11/06/2017         Home Medications    Prior to Admission medications   Not on File    Family History No family history on file.  Social History Social History   Tobacco Use  . Smoking status: Not on file  Substance Use Topics  . Alcohol use: Not on file  . Drug use: Not on file     Allergies   Penicillins and Sulfa antibiotics   Review of Systems Review of Systems  Constitutional: Negative for chills and fever.  HENT: Positive for facial swelling.   Eyes: Negative for visual disturbance.  Respiratory: Negative for cough and shortness of breath.   Cardiovascular: Negative for chest pain.  Gastrointestinal: Negative for abdominal pain, constipation, diarrhea, nausea and vomiting.  Musculoskeletal: Positive for arthralgias, myalgias and neck pain. Negative for neck stiffness.  Skin: Positive for wound.  Neurological: Positive for headaches. Negative for dizziness, syncope, weakness, light-headedness and numbness.     Physical Exam Updated Vital Signs BP 129/81   Pulse 70   Temp 97.6 F (36.4 C) (Temporal)   Resp 14   Ht 6\' 4"  (1.93 m)   Wt 104.3 kg (230 lb)   SpO2 100%   BMI 28.00 kg/m   Physical Exam  Constitutional: He is oriented to person, place, and time. He appears  well-developed and well-nourished.  HENT:  Head: Normocephalic and atraumatic.  Mouth/Throat: Oropharynx is clear and moist.  Right periorbital edema.  Has tenderness to palpation over the right maxillary sinus.  No malocclusion.  No intraoral trauma.  Multiple superficial abrasions to the right side of the face and scalp  Eyes: EOM are normal. Pupils are equal, round, and reactive to light.  No evidence of entrapment.  No hyphema.  Pupils are round.  Neck: Normal range of motion. Neck supple.  Tenderness to palpation over the midline spinous process of the lower cervical/upper thoracic spine.  Cervical collar in place.  Cardiovascular: Normal rate and regular rhythm.  Pulmonary/Chest: Effort normal and breath sounds normal. No stridor. No respiratory distress. He has no wheezes. He has no rales. He exhibits no tenderness.  Abdominal: Soft. Bowel sounds are normal. There is no tenderness. There is no rebound and no guarding.  Musculoskeletal: Normal range of motion. He exhibits tenderness. He exhibits no edema.  Patient has tenderness to palpation over the right trapezius and right deltoid.  Pain with range of motion of the right shoulder.  Distal pulses are 2+.  No midline thoracic or lumbar tenderness.  Pelvis stable.  Full range of motion of bilateral hips.  Distal pulses are 2+.  Neurological: He is alert and oriented to person, place, and time.  5/5 motor in all extremities.  Sensation fully intact.  Skin:  Skin is warm and dry. Capillary refill takes less than 2 seconds. No rash noted. No erythema.  Psychiatric: He has a normal mood and affect. His behavior is normal.  Nursing note and vitals reviewed.    ED Treatments / Results  Labs (all labs ordered are listed, but only abnormal results are displayed) Labs Reviewed  PROTIME-INR - Abnormal; Notable for the following components:      Result Value   Prothrombin Time 26.0 (*)    All other components within normal limits  CBC -  Abnormal; Notable for the following components:   RBC 4.03 (*)    Hemoglobin 11.8 (*)    HCT 37.7 (*)    Platelets 139 (*)    All other components within normal limits  COMPREHENSIVE METABOLIC PANEL - Abnormal; Notable for the following components:   Calcium 8.8 (*)    GFR calc non Af Amer 57 (*)    All other components within normal limits  URINALYSIS, ROUTINE W REFLEX MICROSCOPIC - Abnormal; Notable for the following components:   Hgb urine dipstick LARGE (*)    Ketones, ur 5 (*)    All other components within normal limits    EKG  EKG Interpretation None       Radiology Dg Shoulder Right  Result Date: 11/06/2017 CLINICAL DATA:  Right shoulder pain since falling down escalator today. EXAM: RIGHT SHOULDER - 2+ VIEW COMPARISON:  None. FINDINGS: AP and Y-views were obtained. Patient could not tolerate an axillary view. No evidence of acute fracture or dislocation. The subacromial space is preserved. There are mild acromioclavicular and glenohumeral degenerative changes. Patient has an AICD. IMPRESSION: No acute osseous findings. Electronically Signed   By: Carey Bullocks M.D.   On: 11/06/2017 21:02   Ct Head Wo Contrast  Result Date: 11/06/2017 CLINICAL DATA:  Right-sided head and neck pain after falling down escalator today. Loss of consciousness. EXAM: CT HEAD WITHOUT CONTRAST CT CERVICAL SPINE WITHOUT CONTRAST TECHNIQUE: Multidetector CT imaging of the head and cervical spine was performed following the standard protocol without intravenous contrast. Multiplanar CT image reconstructions of the cervical spine were also generated. COMPARISON:  None. FINDINGS: CT HEAD FINDINGS Brain: There is no evidence of acute intracranial hemorrhage, mass lesion, brain edema or extra-axial fluid collection. The ventricles and subarachnoid spaces are appropriately sized for age. There is no CT evidence of acute cortical infarction. Vascular:  No hyperdense vessel identified. Skull: Negative for  fracture or focal lesion. Sinuses/Orbits: There is right periorbital soft tissue swelling associated with multiple acute right-sided facial fractures. There is a depressed and mildly comminuted fracture of the right zygomatic arch. There are fractures of the right orbital floor and lateral wall. There are fractures of the anterior and lateral walls the right maxilla. No left-sided facial fractures are seen. The globes are intact. There is some soft tissue emphysema in the right malar region. There is mucosal thickening and/or blood in the ethmoid and right maxillary and frontal sinuses. Other: None. CT CERVICAL SPINE FINDINGS Alignment: 2 mm of retrolisthesis at C3-4.  Otherwise normal. Skull base and vertebrae: No evidence of acute cervical spine fracture or traumatic subluxation. However, there is a possible nondisplaced fracture of the T2 spinous process. There is multilevel cervical spondylosis with ankylosis across the C2-3 disc and facet joints. Moderate endplate degenerative changes are present at C3-4. Soft tissues and spinal canal: No prevertebral fluid or swelling. No visible canal hematoma. Disc levels: As above, multilevel spondylosis. There is severe right foraminal narrowing  at C3-4 secondary to uncinate spurring and facet hypertrophy. There mild-to-moderate left greater than right foraminal narrowing at C4-5 and C5-6. Upper chest: No acute findings. Other: Carotid atherosclerosis. IMPRESSION: 1. No evidence of acute intracranial or calvarial injury. 2. Multiple right-sided facial fractures (tripod fracture) as described. No evidence of orbital hematoma. There blood in the paranasal sinuses. 3. No evidence of acute cervical spine fracture, traumatic subluxation or static signs of instability. Possible nondisplaced fracture of the T2 spinous process. 4. Multilevel cervical spondylosis. Electronically Signed   By: Carey Bullocks M.D.   On: 11/06/2017 19:46   Ct Cervical Spine Wo Contrast  Result  Date: 11/06/2017 CLINICAL DATA:  Right-sided head and neck pain after falling down escalator today. Loss of consciousness. EXAM: CT HEAD WITHOUT CONTRAST CT CERVICAL SPINE WITHOUT CONTRAST TECHNIQUE: Multidetector CT imaging of the head and cervical spine was performed following the standard protocol without intravenous contrast. Multiplanar CT image reconstructions of the cervical spine were also generated. COMPARISON:  None. FINDINGS: CT HEAD FINDINGS Brain: There is no evidence of acute intracranial hemorrhage, mass lesion, brain edema or extra-axial fluid collection. The ventricles and subarachnoid spaces are appropriately sized for age. There is no CT evidence of acute cortical infarction. Vascular:  No hyperdense vessel identified. Skull: Negative for fracture or focal lesion. Sinuses/Orbits: There is right periorbital soft tissue swelling associated with multiple acute right-sided facial fractures. There is a depressed and mildly comminuted fracture of the right zygomatic arch. There are fractures of the right orbital floor and lateral wall. There are fractures of the anterior and lateral walls the right maxilla. No left-sided facial fractures are seen. The globes are intact. There is some soft tissue emphysema in the right malar region. There is mucosal thickening and/or blood in the ethmoid and right maxillary and frontal sinuses. Other: None. CT CERVICAL SPINE FINDINGS Alignment: 2 mm of retrolisthesis at C3-4.  Otherwise normal. Skull base and vertebrae: No evidence of acute cervical spine fracture or traumatic subluxation. However, there is a possible nondisplaced fracture of the T2 spinous process. There is multilevel cervical spondylosis with ankylosis across the C2-3 disc and facet joints. Moderate endplate degenerative changes are present at C3-4. Soft tissues and spinal canal: No prevertebral fluid or swelling. No visible canal hematoma. Disc levels: As above, multilevel spondylosis. There is severe  right foraminal narrowing at C3-4 secondary to uncinate spurring and facet hypertrophy. There mild-to-moderate left greater than right foraminal narrowing at C4-5 and C5-6. Upper chest: No acute findings. Other: Carotid atherosclerosis. IMPRESSION: 1. No evidence of acute intracranial or calvarial injury. 2. Multiple right-sided facial fractures (tripod fracture) as described. No evidence of orbital hematoma. There blood in the paranasal sinuses. 3. No evidence of acute cervical spine fracture, traumatic subluxation or static signs of instability. Possible nondisplaced fracture of the T2 spinous process. 4. Multilevel cervical spondylosis. Electronically Signed   By: Carey Bullocks M.D.   On: 11/06/2017 19:46   Dg Chest Portable 1 View  Result Date: 11/06/2017 CLINICAL DATA:  Right-sided chest pain after falling down escalator steps today. Initial encounter. EXAM: PORTABLE CHEST 1 VIEW COMPARISON:  None. FINDINGS: 1841 hr. Left subclavian AICD with multiple leads extending into the right atrium and right ventricle. Some of these are likely remnants of previous pacemakers. The heart size is normal. There is mild aortic and great vessel tortuosity. There is elevation of the right hemidiaphragm with mild right basilar atelectasis. No pneumothorax or significant pleural effusion identified. No acute rib fractures are seen. IMPRESSION:  No definite acute findings. Elevated right hemidiaphragm with right basilar atelectasis. Electronically Signed   By: Carey Bullocks M.D.   On: 11/06/2017 19:03    Procedures Procedures (including critical care time)  Medications Ordered in ED Medications  fentaNYL (SUBLIMAZE) injection 50 mcg (not administered)  amiodarone (PACERONE) tablet 200 mg (not administered)  metoprolol tartrate (LOPRESSOR) tablet 50 mg (not administered)  fentaNYL (SUBLIMAZE) injection 50 mcg (50 mcg Intravenous Given 11/06/17 1844)  fentaNYL (SUBLIMAZE) injection 50 mcg (50 mcg Intravenous Given  11/06/17 2005)  Tdap (BOOSTRIX) injection 0.5 mL (0.5 mLs Intramuscular Given 11/06/17 2108)     Initial Impression / Assessment and Plan / ED Course  I have reviewed the triage vital signs and the nursing notes.  Pertinent labs & imaging results that were available during my care of the patient were reviewed by me and considered in my medical decision making (see chart for details).    Lacerations are superficial and do not need surgical repair. Discussed with Dr. Lazarus Salines who will consult on patient regarding his facial fractures.  Dr. Lindie Spruce will see patient in the emergency department.  Hospitalist to admit. Final Clinical Impressions(s) / ED Diagnoses   Final diagnoses:  Closed head injury, initial encounter  Closed fracture of maxilla, unspecified laterality, initial encounter (HCC)  Closed fracture of spinous process of thoracic vertebra, initial encounter [facility])  Multiple lacerations    ED Discharge Orders    None       Loren Racer, MD 11/06/17 2242

## 2017-11-07 ENCOUNTER — Encounter (HOSPITAL_COMMUNITY): Payer: Self-pay | Admitting: General Practice

## 2017-11-07 ENCOUNTER — Observation Stay (HOSPITAL_COMMUNITY): Payer: Medicare HMO

## 2017-11-07 ENCOUNTER — Other Ambulatory Visit: Payer: Self-pay

## 2017-11-07 DIAGNOSIS — S02401A Maxillary fracture, unspecified, initial encounter for closed fracture: Secondary | ICD-10-CM | POA: Diagnosis not present

## 2017-11-07 DIAGNOSIS — I482 Chronic atrial fibrillation: Secondary | ICD-10-CM | POA: Diagnosis not present

## 2017-11-07 DIAGNOSIS — S22008A Other fracture of unspecified thoracic vertebra, initial encounter for closed fracture: Secondary | ICD-10-CM | POA: Diagnosis not present

## 2017-11-07 LAB — CBC
HCT: 34.8 % — ABNORMAL LOW (ref 39.0–52.0)
Hemoglobin: 11 g/dL — ABNORMAL LOW (ref 13.0–17.0)
MCH: 29.7 pg (ref 26.0–34.0)
MCHC: 31.6 g/dL (ref 30.0–36.0)
MCV: 94.1 fL (ref 78.0–100.0)
Platelets: 126 10*3/uL — ABNORMAL LOW (ref 150–400)
RBC: 3.7 MIL/uL — AB (ref 4.22–5.81)
RDW: 14.6 % (ref 11.5–15.5)
WBC: 5.6 10*3/uL (ref 4.0–10.5)

## 2017-11-07 LAB — BASIC METABOLIC PANEL
Anion gap: 7 (ref 5–15)
BUN: 16 mg/dL (ref 6–20)
CO2: 30 mmol/L (ref 22–32)
CREATININE: 0.99 mg/dL (ref 0.61–1.24)
Calcium: 8.4 mg/dL — ABNORMAL LOW (ref 8.9–10.3)
Chloride: 101 mmol/L (ref 101–111)
GFR calc Af Amer: >60 mL/min (ref >60)
Glucose, Bld: 122 mg/dL — ABNORMAL HIGH (ref 65–99)
POTASSIUM: 3.8 mmol/L (ref 3.5–5.1)
SODIUM: 138 mmol/L (ref 135–145)

## 2017-11-07 MED ORDER — BISACODYL 10 MG RE SUPP: 10.0000 mg | Freq: Every day | RECTAL | Status: DC | PRN

## 2017-11-07 MED ORDER — CEPHALEXIN 250 MG PO CAPS
250.0000 mg | ORAL_CAPSULE | Freq: Four times a day (QID) | ORAL | Status: DC
Start: 2017-11-07 — End: 2017-11-08
  Administered 2017-11-07 – 2017-11-08 (×3): 250 mg via ORAL
  Filled 2017-11-07 (×5): qty 1

## 2017-11-07 MED ORDER — ENSURE ENLIVE PO LIQD: 237.0000 mL | Freq: Two times a day (BID) | ORAL | Status: DC

## 2017-11-07 MED ORDER — POLYETHYLENE GLYCOL 3350 17 G PO PACK
17.0000 g | PACK | Freq: Every day | ORAL | Status: DC
  Administered 2017-11-07 – 2017-11-08 (×2): 17 g via ORAL
  Filled 2017-11-07 (×2): qty 1

## 2017-11-07 MED ORDER — FLEET ENEMA 7-19 GM/118ML RE ENEM: 1.0000 | ENEMA | Freq: Every day | RECTAL | Status: DC | PRN

## 2017-11-07 MED ORDER — OXYCODONE HCL 5 MG PO TABS
5.0000 mg | ORAL_TABLET | ORAL | Status: DC | PRN
  Administered 2017-11-07 – 2017-11-08 (×3): 10 mg via ORAL
  Filled 2017-11-07 (×3): qty 2

## 2017-11-07 MED ORDER — DOCUSATE SODIUM 100 MG PO CAPS
100.0000 mg | ORAL_CAPSULE | Freq: Two times a day (BID) | ORAL | Status: DC
  Administered 2017-11-07 – 2017-11-08 (×3): 100 mg via ORAL
  Filled 2017-11-07 (×3): qty 1

## 2017-11-07 NOTE — Progress Notes (Signed)
Received report on pt.

## 2017-11-07 NOTE — Progress Notes (Signed)
[facility] Progress Note     Subjective: CC: pain in R face Patient complaining mostly of pain in R face, and also reports mild pain in R shoulder and upper back. Pain well controlled with medications. Reports he ate breakfast this AM and did have increased pain with trying to chew food. Reports hx of chronic constipation and has some mild abdominal pain that he associates with this. Denies SOB, chest pain, nausea, blurred vision, confusion. Patient would like to try to get up, states he uses a walker at home. VSS.   Objective: Vital signs in last 24 hours: Temp:  [97.6 F (36.4 C)] 97.6 F (36.4 C) (01/09 1834) Pulse Rate:  [69-70] 70 (01/10 0830) Resp:  [12-19] 14 (01/10 0830) BP: (91-133)/(62-87) 95/63 (01/10 0800) SpO2:  [94 %-100 %] 99 % (01/10 0830) Weight:  [104.3 kg (230 lb)] 104.3 kg (230 lb) (01/09 1846)    Intake/Output from previous day: 01/09 0701 - 01/10 0700 In: -  Out: 350 [Urine:350] Intake/Output this shift: No intake/output data recorded.  Physical Exam  Constitutional: He is oriented to person, place, and time. He appears well-developed and well-nourished. No distress.  HENT:  Head: Head is with abrasion and with right periorbital erythema. Head is without Battle's sign.  Left Ear: External ear normal.  Nose: Nose normal.  Abrasions to the right cheek, right ear   Eyes: Conjunctivae, EOM and lids are normal. Pupils are equal, round, and reactive to light. No scleral icterus.  Neck: Trachea normal, normal range of motion and full passive range of motion without pain. Neck supple. No spinous process tenderness present.  Cardiovascular: Normal rate, regular rhythm and S1 normal.  Respiratory: Effort normal and breath sounds normal. No respiratory distress. He has no decreased breath sounds.  GI: Soft. Normal appearance. He exhibits no distension. Bowel sounds are decreased. There is no tenderness. There is no rigidity, no rebound and no guarding.   Musculoskeletal:       Right shoulder: He exhibits tenderness. He exhibits no bony tenderness, no swelling and no deformity.       Thoracic back: He exhibits bony tenderness (T2). He exhibits no deformity.  ROM grossly intact in right shoulder Pain with Empty Can test on the right shoulder  Neurological: He is alert and oriented to person, place, and time. He has normal strength. No cranial nerve deficit.  Skin: Skin is warm and dry. Abrasion noted.  Abrasions to the left hand, dressing in place   Psychiatric: He has a normal mood and affect. His speech is normal and behavior is normal.    Lab Results:  Recent Labs    11/06/17 1839 11/07/17 0511  WBC 6.0 5.6  HGB 11.8* 11.0*  HCT 37.7* 34.8*  PLT 139* 126*   BMET Recent Labs    11/06/17 1839 11/07/17 0511  NA 138 138  K 4.1 3.8  CL 103 101  CO2 29 30  GLUCOSE 96 122*  BUN 20 16  CREATININE 1.19 0.99  CALCIUM 8.8* 8.4*   PT/INR Recent Labs    11/06/17 1839  LABPROT 26.0*  INR 2.41   CMP     Component Value Date/Time   NA 138 11/07/2017 0511   K 3.8 11/07/2017 0511   CL 101 11/07/2017 0511   CO2 30 11/07/2017 0511   GLUCOSE 122 (H) 11/07/2017 0511   BUN 16 11/07/2017 0511   CREATININE 0.99 11/07/2017 0511   CALCIUM 8.4 (L) 11/07/2017 0511   PROT 6.6 11/06/2017  1839   ALBUMIN 3.6 11/06/2017 1839   AST 33 11/06/2017 1839   ALT 24 11/06/2017 1839   ALKPHOS 72 11/06/2017 1839   BILITOT 0.8 11/06/2017 1839   GFRNONAA >60 11/07/2017 0511   GFRAA >60 11/07/2017 0511   Lipase  No results found for: LIPASE     Studies/Results: Dg Shoulder Right  Result Date: 11/06/2017 CLINICAL DATA:  Right shoulder pain since falling down escalator today. EXAM: RIGHT SHOULDER - 2+ VIEW COMPARISON:  None. FINDINGS: AP and Y-views were obtained. Patient could not tolerate an axillary view. No evidence of acute fracture or dislocation. The subacromial space is preserved. There are mild acromioclavicular and glenohumeral  degenerative changes. Patient has an AICD. IMPRESSION: No acute osseous findings. Electronically Signed   By: Carey Bullocks M.D.   On: 11/06/2017 21:02   Ct Head Wo Contrast  Result Date: 11/06/2017 CLINICAL DATA:  Right-sided head and neck pain after falling down escalator today. Loss of consciousness. EXAM: CT HEAD WITHOUT CONTRAST CT CERVICAL SPINE WITHOUT CONTRAST TECHNIQUE: Multidetector CT imaging of the head and cervical spine was performed following the standard protocol without intravenous contrast. Multiplanar CT image reconstructions of the cervical spine were also generated. COMPARISON:  None. FINDINGS: CT HEAD FINDINGS Brain: There is no evidence of acute intracranial hemorrhage, mass lesion, brain edema or extra-axial fluid collection. The ventricles and subarachnoid spaces are appropriately sized for age. There is no CT evidence of acute cortical infarction. Vascular:  No hyperdense vessel identified. Skull: Negative for fracture or focal lesion. Sinuses/Orbits: There is right periorbital soft tissue swelling associated with multiple acute right-sided facial fractures. There is a depressed and mildly comminuted fracture of the right zygomatic arch. There are fractures of the right orbital floor and lateral wall. There are fractures of the anterior and lateral walls the right maxilla. No left-sided facial fractures are seen. The globes are intact. There is some soft tissue emphysema in the right malar region. There is mucosal thickening and/or blood in the ethmoid and right maxillary and frontal sinuses. Other: None. CT CERVICAL SPINE FINDINGS Alignment: 2 mm of retrolisthesis at C3-4.  Otherwise normal. Skull base and vertebrae: No evidence of acute cervical spine fracture or traumatic subluxation. However, there is a possible nondisplaced fracture of the T2 spinous process. There is multilevel cervical spondylosis with ankylosis across the C2-3 disc and facet joints. Moderate endplate  degenerative changes are present at C3-4. Soft tissues and spinal canal: No prevertebral fluid or swelling. No visible canal hematoma. Disc levels: As above, multilevel spondylosis. There is severe right foraminal narrowing at C3-4 secondary to uncinate spurring and facet hypertrophy. There mild-to-moderate left greater than right foraminal narrowing at C4-5 and C5-6. Upper chest: No acute findings. Other: Carotid atherosclerosis. IMPRESSION: 1. No evidence of acute intracranial or calvarial injury. 2. Multiple right-sided facial fractures (tripod fracture) as described. No evidence of orbital hematoma. There blood in the paranasal sinuses. 3. No evidence of acute cervical spine fracture, traumatic subluxation or static signs of instability. Possible nondisplaced fracture of the T2 spinous process. 4. Multilevel cervical spondylosis. Electronically Signed   By: Carey Bullocks M.D.   On: 11/06/2017 19:46   Ct Cervical Spine Wo Contrast  Result Date: 11/06/2017 CLINICAL DATA:  Right-sided head and neck pain after falling down escalator today. Loss of consciousness. EXAM: CT HEAD WITHOUT CONTRAST CT CERVICAL SPINE WITHOUT CONTRAST TECHNIQUE: Multidetector CT imaging of the head and cervical spine was performed following the standard protocol without intravenous contrast. Multiplanar CT image  reconstructions of the cervical spine were also generated. COMPARISON:  None. FINDINGS: CT HEAD FINDINGS Brain: There is no evidence of acute intracranial hemorrhage, mass lesion, brain edema or extra-axial fluid collection. The ventricles and subarachnoid spaces are appropriately sized for age. There is no CT evidence of acute cortical infarction. Vascular:  No hyperdense vessel identified. Skull: Negative for fracture or focal lesion. Sinuses/Orbits: There is right periorbital soft tissue swelling associated with multiple acute right-sided facial fractures. There is a depressed and mildly comminuted fracture of the right  zygomatic arch. There are fractures of the right orbital floor and lateral wall. There are fractures of the anterior and lateral walls the right maxilla. No left-sided facial fractures are seen. The globes are intact. There is some soft tissue emphysema in the right malar region. There is mucosal thickening and/or blood in the ethmoid and right maxillary and frontal sinuses. Other: None. CT CERVICAL SPINE FINDINGS Alignment: 2 mm of retrolisthesis at C3-4.  Otherwise normal. Skull base and vertebrae: No evidence of acute cervical spine fracture or traumatic subluxation. However, there is a possible nondisplaced fracture of the T2 spinous process. There is multilevel cervical spondylosis with ankylosis across the C2-3 disc and facet joints. Moderate endplate degenerative changes are present at C3-4. Soft tissues and spinal canal: No prevertebral fluid or swelling. No visible canal hematoma. Disc levels: As above, multilevel spondylosis. There is severe right foraminal narrowing at C3-4 secondary to uncinate spurring and facet hypertrophy. There mild-to-moderate left greater than right foraminal narrowing at C4-5 and C5-6. Upper chest: No acute findings. Other: Carotid atherosclerosis. IMPRESSION: 1. No evidence of acute intracranial or calvarial injury. 2. Multiple right-sided facial fractures (tripod fracture) as described. No evidence of orbital hematoma. There blood in the paranasal sinuses. 3. No evidence of acute cervical spine fracture, traumatic subluxation or static signs of instability. Possible nondisplaced fracture of the T2 spinous process. 4. Multilevel cervical spondylosis. Electronically Signed   By: Carey Bullocks M.D.   On: 11/06/2017 19:46   Dg Chest Portable 1 View  Result Date: 11/06/2017 CLINICAL DATA:  Right-sided chest pain after falling down escalator steps today. Initial encounter. EXAM: PORTABLE CHEST 1 VIEW COMPARISON:  None. FINDINGS: 1841 hr. Left subclavian AICD with multiple leads  extending into the right atrium and right ventricle. Some of these are likely remnants of previous pacemakers. The heart size is normal. There is mild aortic and great vessel tortuosity. There is elevation of the right hemidiaphragm with mild right basilar atelectasis. No pneumothorax or significant pleural effusion identified. No acute rib fractures are seen. IMPRESSION: No definite acute findings. Elevated right hemidiaphragm with right basilar atelectasis. Electronically Signed   By: Carey Bullocks M.D.   On: 11/06/2017 19:03    Anti-infectives: Anti-infectives (From admission, onward)   None       Assessment/Plan Fall R zygomatic fx/R orbital floor fx/ R maxilla fx - CT maxillofacial pending, Dr. Lazarus Salines to assess today  R periorbital swelling - ice prn, Dr. 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

## 2017-11-07 NOTE — Progress Notes (Addendum)
PROGRESS NOTE    Unique Searfoss  ZOX:[id number] DOB: [birth year] DOA: 11/06/2017 PCP: Rubbie Battiest, MD   Brief Narrative: Patient is a 77 year old male with past medical history of atrial fibrillation, nonischemic cardiomyopathy status post ICD placement, hypothyroidism, hyperlipidemia who [place].  Patient states he might have lost consciousness but not sure.  He immediately developed facial bruise with pain and also lower extremity pain.  In the emergency department he underwent CT of the head and C-spine which was right-sided multiple fracture of facial bones and also possible fracture of T2 spinous process. Patient was evaluated by trauma surgery and waiting for evaluation by ENT.  Assessment & Plan:   Principal Problem:   Fall Active Problems:   Atrial fibrillation, chronic (HCC)   NICM (nonischemic cardiomyopathy) (HCC)   Hypothyroidism  Fall with facial fractures: CT of the face showed facial fractures.  CT-spine also showed possible T2 spinous process fracture.  Trauma surgery following.  No plan for further intervention.  Recommended cold compression, local wound care. Dr. Lazarus Salines of ENT has been requested for the  Consult.  Awaiting further evaluation. Continue pain medications. CT maxillofacial: Right tripod fracture. Fractures through the anterior and lateral wall of the right maxillary sinus. No evidence of entrapment. Blood within the right maxillary sinus Will order for physical therapy evaluation  Atrial fibrillation: Chronic.  Currently in normal sinus rhythm.  On amiodarone and metoprolol which we will continue.   We will resume coumadin on discharge  Nonischemic cardiomyopathy: Status post AICD.  Currently stable  Hypothyroidism: On Synthyroid  Normocytic normochromic anemia/thrombocytopenia: Currently stable.  We will continue to follow CBC.  Chronic constipation: Bowel regimen ordered.       DVT  prophylaxis:SCD Code Status: Full Family Communication: None present at the bedside Disposition Plan: Home after ENT evaluation   Consultants: ENT/trauma surgery  Procedures: None  Antimicrobials: None  Subjective: Patient seen and examined the bedside this morning.  Overall status is stable.  Complains of facial pain.  Patient is alert and oriented.  Objective: Vitals:   11/07/17 1300 11/07/17 1400 11/07/17 1430 11/07/17 1527  BP: 102/69 123/76  (!) 93/53  Pulse: 70  70 71  Resp: 14 16 17 18   Temp:    97.8 F (36.6 C)  TempSrc:    Oral  SpO2: 98%  99% 100%  Weight:      Height:        Intake/Output Summary (Last 24 hours) at 11/07/2017 1528 Last data filed at 11/07/2017 8119 Gross per 24 hour  Intake -  Output 350 ml  Net -350 ml   Filed Weights   11/06/17 1846  Weight: 104.3 kg (230 lb)    Examination:  General exam: Appears calm and comfortable ,Not in distress,average built Face: Bruising of the right side of the face, right periorbital edema Respiratory system: Bilateral equal air entry, normal vesicular breath sounds, no wheezes or crackles  Cardiovascular system: S1 & S2 heard, RRR. No JVD, murmurs, rubs, gallops or clicks. No pedal edema. Gastrointestinal system: Abdomen is nondistended, soft and nontender. No organomegaly or masses felt. Normal bowel sounds heard. Central nervous system: Alert and oriented. No focal neurological deficits. Extremities: No edema, no clubbing ,no cyanosis, distal peripheral pulses palpable, multiple bruises/abrasions Skin: No rashes, lesions or ulcers,no icterus ,no pallor Psychiatry: Judgement and insight appear normal. Mood & affect appropriate.     Data Reviewed: I have personally reviewed following labs and imaging studies  CBC: Recent Labs  Lab 11/06/17 1839 11/07/17 0511  WBC 6.0 5.6  HGB 11.8* 11.0*  HCT 37.7* 34.8*  MCV 93.5 94.1  PLT 139* 126*   Basic Metabolic Panel: Recent Labs  Lab 11/06/17 1839  11/07/17 0511  NA 138 138  K 4.1 3.8  CL 103 101  CO2 29 30  GLUCOSE 96 122*  BUN 20 16  CREATININE 1.19 0.99  CALCIUM 8.8* 8.4*   GFR: Estimated Creatinine Clearance: 82.9 mL/min (by C-G formula based on SCr of 0.99 mg/dL). Liver Function Tests: Recent Labs  Lab 11/06/17 1839  AST 33  ALT 24  ALKPHOS 72  BILITOT 0.8  PROT 6.6  ALBUMIN 3.6   No results for input(s): LIPASE, AMYLASE in the last 168 hours. No results for input(s): AMMONIA in the last 168 hours. Coagulation Profile: Recent Labs  Lab 11/06/17 1839  INR 2.41   Cardiac Enzymes: No results for input(s): CKTOTAL, CKMB, CKMBINDEX, TROPONINI in the last 168 hours. BNP (last 3 results) No results for input(s): PROBNP in the last 8760 hours. HbA1C: No results for input(s): HGBA1C in the last 72 hours. CBG: No results for input(s): GLUCAP in the last 168 hours. Lipid Profile: No results for input(s): CHOL, HDL, LDLCALC, TRIG, CHOLHDL, LDLDIRECT in the last 72 hours. Thyroid Function Tests: No results for input(s): TSH, T4TOTAL, FREET4, T3FREE, THYROIDAB in the last 72 hours. Anemia Panel: No results for input(s): VITAMINB12, FOLATE, FERRITIN, TIBC, IRON, RETICCTPCT in the last 72 hours. Sepsis Labs: No results for input(s): PROCALCITON, LATICACIDVEN in the last 168 hours.  No results found for this or any previous visit (from the past 240 hour(s)).       Radiology Studies: Dg Shoulder Right  Result Date: 11/06/2017 CLINICAL DATA:  Right shoulder pain since falling down escalator today. EXAM: RIGHT SHOULDER - 2+ VIEW COMPARISON:  None. FINDINGS: AP and Y-views were obtained. Patient could not tolerate an axillary view. No evidence of acute fracture or dislocation. The subacromial space is preserved. There are mild acromioclavicular and glenohumeral degenerative changes. Patient has an AICD. IMPRESSION: No acute osseous findings. Electronically Signed   By: Carey Bullocks M.D.   On: 11/06/2017 21:02   Ct  Head Wo Contrast  Result Date: 11/06/2017 CLINICAL DATA:  Right-sided head and neck pain after falling down escalator today. Loss of consciousness. EXAM: CT HEAD WITHOUT CONTRAST CT CERVICAL SPINE WITHOUT CONTRAST TECHNIQUE: Multidetector CT imaging of the head and cervical spine was performed following the standard protocol without intravenous contrast. Multiplanar CT image reconstructions of the cervical spine were also generated. COMPARISON:  None. FINDINGS: CT HEAD FINDINGS Brain: There is no evidence of acute intracranial hemorrhage, mass lesion, brain edema or extra-axial fluid collection. The ventricles and subarachnoid spaces are appropriately sized for age. There is no CT evidence of acute cortical infarction. Vascular:  No hyperdense vessel identified. Skull: Negative for fracture or focal lesion. Sinuses/Orbits: There is right periorbital soft tissue swelling associated with multiple acute right-sided facial fractures. There is a depressed and mildly comminuted fracture of the right zygomatic arch. There are fractures of the right orbital floor and lateral wall. There are fractures of the anterior and lateral walls the right maxilla. No left-sided facial fractures are seen. The globes are intact. There is some soft tissue emphysema in the right malar region. There is mucosal thickening and/or blood in the ethmoid and right maxillary and frontal sinuses. Other: None. CT CERVICAL SPINE FINDINGS Alignment: 2 mm of retrolisthesis at C3-4.  Otherwise normal. Skull base and vertebrae: No evidence of acute cervical spine fracture or traumatic subluxation. However, there is a possible nondisplaced fracture of the T2 spinous process. There is multilevel cervical spondylosis with ankylosis across the C2-3 disc and facet joints. Moderate endplate degenerative changes are present at C3-4. Soft tissues and spinal canal: No prevertebral fluid or swelling. No visible canal hematoma. Disc levels: As above, multilevel  spondylosis. There is severe right foraminal narrowing at C3-4 secondary to uncinate spurring and facet hypertrophy. There mild-to-moderate left greater than right foraminal narrowing at C4-5 and C5-6. Upper chest: No acute findings. Other: Carotid atherosclerosis. IMPRESSION: 1. No evidence of acute intracranial or calvarial injury. 2. Multiple right-sided facial fractures (tripod fracture) as described. No evidence of orbital hematoma. There blood in the paranasal sinuses. 3. No evidence of acute cervical spine fracture, traumatic subluxation or static signs of instability. Possible nondisplaced fracture of the T2 spinous process. 4. Multilevel cervical spondylosis. Electronically Signed   By: Carey Bullocks M.D.   On: 11/06/2017 19:46   Ct Cervical Spine Wo Contrast  Result Date: 11/06/2017 CLINICAL DATA:  Right-sided head and neck pain after falling down escalator today. Loss of consciousness. EXAM: CT HEAD WITHOUT CONTRAST CT CERVICAL SPINE WITHOUT CONTRAST TECHNIQUE: Multidetector CT imaging of the head and cervical spine was performed following the standard protocol without intravenous contrast. Multiplanar CT image reconstructions of the cervical spine were also generated. COMPARISON:  None. FINDINGS: CT HEAD FINDINGS Brain: There is no evidence of acute intracranial hemorrhage, mass lesion, brain edema or extra-axial fluid collection. The ventricles and subarachnoid spaces are appropriately sized for age. There is no CT evidence of acute cortical infarction. Vascular:  No hyperdense vessel identified. Skull: Negative for fracture or focal lesion. Sinuses/Orbits: There is right periorbital soft tissue swelling associated with multiple acute right-sided facial fractures. There is a depressed and mildly comminuted fracture of the right zygomatic arch. There are fractures of the right orbital floor and lateral wall. There are fractures of the anterior and lateral walls the right maxilla. No left-sided  facial fractures are seen. The globes are intact. There is some soft tissue emphysema in the right malar region. There is mucosal thickening and/or blood in the ethmoid and right maxillary and frontal sinuses. Other: None. CT CERVICAL SPINE FINDINGS Alignment: 2 mm of retrolisthesis at C3-4.  Otherwise normal. Skull base and vertebrae: No evidence of acute cervical spine fracture or traumatic subluxation. However, there is a possible nondisplaced fracture of the T2 spinous process. There is multilevel cervical spondylosis with ankylosis across the C2-3 disc and facet joints. Moderate endplate degenerative changes are present at C3-4. Soft tissues and spinal canal: No prevertebral fluid or swelling. No visible canal hematoma. Disc levels: As above, multilevel spondylosis. There is severe right foraminal narrowing at C3-4 secondary to uncinate spurring and facet hypertrophy. There mild-to-moderate left greater than right foraminal narrowing at C4-5 and C5-6. Upper chest: No acute findings. Other: Carotid atherosclerosis. IMPRESSION: 1. No evidence of acute intracranial or calvarial injury. 2. Multiple right-sided facial fractures (tripod fracture) as described. No evidence of orbital hematoma. There blood in the paranasal sinuses. 3. No evidence of acute cervical spine fracture, traumatic subluxation or static signs of instability. Possible nondisplaced fracture of the T2 spinous process. 4. Multilevel cervical spondylosis. Electronically Signed   By: Carey Bullocks M.D.   On: 11/06/2017 19:46   Dg Chest Portable 1 View  Result Date: 11/06/2017 CLINICAL DATA:  Right-sided chest pain after falling down escalator steps  today. Initial encounter. EXAM: PORTABLE CHEST 1 VIEW COMPARISON:  None. FINDINGS: 1841 hr. Left subclavian AICD with multiple leads extending into the right atrium and right ventricle. Some of these are likely remnants of previous pacemakers. The heart size is normal. There is mild aortic and great  vessel tortuosity. There is elevation of the right hemidiaphragm with mild right basilar atelectasis. No pneumothorax or significant pleural effusion identified. No acute rib fractures are seen. IMPRESSION: No definite acute findings. Elevated right hemidiaphragm with right basilar atelectasis. Electronically Signed   By: Carey Bullocks M.D.   On: 11/06/2017 19:03   Ct Maxillofacial Wo Contrast  Result Date: 11/07/2017 CLINICAL DATA:  Fall.  Right face lacerations. EXAM: CT MAXILLOFACIAL WITHOUT CONTRAST TECHNIQUE: Multidetector CT imaging of the maxillofacial structures was performed. Multiplanar CT image reconstructions were also generated. COMPARISON:  Head CT performed today. FINDINGS: Osseous: Fracture through the right zygomatic arch, lateral wall of the right orbit, and orbital floor. No evidence of entrapment or herniation of orbital contents into the right maxillary sinus. Fractures through the anterior and lateral walls of the right maxillary sinus. No mandibular fracture. Orbits: Right tripod fracture as described above. Soft tissue swelling anterior to the right orbit. No intraorbital hematoma. Globe is intact. Sinuses: Layering blood in the right maxillary sinus. Opacification of scattered ethmoid air cells. Soft tissues: Soft tissue swelling over the right face. Limited intracranial: No acute findings IMPRESSION: Right tripod fracture. Fractures through the anterior and lateral wall of the right maxillary sinus. No evidence of entrapment. Blood within the right maxillary sinus. Electronically Signed   By: Charlett Nose M.D.   On: 11/07/2017 10:10        Scheduled Meds: . amiodarone  200 mg Oral QPM  . brimonidine  1 drop Left Eye TID  . cholecalciferol  2,000 Units Oral BID  . docusate sodium  100 mg Oral BID  . escitalopram  5 mg Oral Daily  . famotidine  20 mg Oral BID  . levothyroxine  100 mcg Oral QAC breakfast  . LORazepam  2 mg Oral QHS  . metoprolol succinate  100 mg Oral QPM   . pantoprazole  40 mg Oral BID  . polyethylene glycol  17 g Oral Daily  . simvastatin  10 mg Oral QHS   Continuous Infusions:   LOS: 0 days    Time spent: 25 minutes    Vikas Wegmann Salli Quarry, MD [facility] Pager [phone]  If 7PM-7AM, please contact night-coverage [email or web address] Password [email or web address] 11/07/2017, 3:28 PM

## 2017-11-07 NOTE — Discharge Instructions (Signed)
ENT Instructions You have broken your RIGHT cheek bone and around your RIGHT eye socket.  No nose blowing x 2 weeks. See your eye doctor soon I would like to see you back in 4-5 days, [phone] for an appointment Diet as comfortable Sleep with head elevated 3-4 nights.

## 2017-11-07 NOTE — Consult Note (Signed)
Devon Jennings, Devon Jennings 77 y.o., male [id number]     Chief Complaint: facial trauma  HPI: 77 yo wm, stumbled and tumbled down a full flight of escalator stairs at [facility] airport yesterday evening. Hit face, RIGHT shoulder, LEFT wrist.  CT maxillofacial shows RIGHT trimalar zygomatic fx with no displacement of orbital floor, some rotation of the zygoma proper.  Arch is depressed and impinging on temporalis muscle.  Mild air in orbit.    No vision complaints including diplopia.  Right eye is better seeing eye.  Pain with ROM jaw yesterday, but eating soft solids without difficulty today.    PMH: Past Medical History:  Diagnosis Date  . AICD (automatic cardioverter/defibrillator) present   . Atrial fibrillation ([place])   . CHF (congestive heart failure) ([place])   . 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 Dysrhythmia    h/o VT/notes 11/06/2017  . Fall (on)(from) escalator, initial encounter 11/06/2017   "was at the top of escalator; fell down 2/3 of the way down"  . Hyperlipidemia    Archie Endo 11/06/2017  . Hypothyroidism   . Nonischemic cardiomyopathy ([place])    Archie Endo 11/06/2017  . On home oxygen therapy     Surg Hx: Past Surgical History:  Procedure Laterality Date  . CARDIAC DEFIBRILLATOR PLACEMENT    . CHOLECYSTECTOMY      FHx:   Family History  Problem Relation Age of Onset  . Hypertension Other    SocHx:  reports that  has never smoked. he has never used smokeless tobacco. He reports that he does not drink alcohol or use drugs.  ALLERGIES:  Allergies  Allergen Reactions  . Penicillins Hives  . Sulfa Antibiotics Rash    Medications Prior to Admission  Medication Sig Dispense Refill  . amiodarone (PACERONE) 200 MG tablet Take 200 mg by mouth every evening.    . brimonidine (ALPHAGAN) 0.15 % ophthalmic solution Place 1 drop into the left eye 3 (three) times daily.    . Cholecalciferol (VITAMIN D3) 2000 units capsule Take 2,000 Units by mouth 2 (two) times daily.    Marland Kitchen  escitalopram (LEXAPRO) 5 MG tablet Take 5 mg by mouth daily.    . fluticasone (FLONASE) 50 MCG/ACT nasal spray Place 1 spray into the nose daily as needed for allergies.     . hydroxypropyl methylcellulose / hypromellose (ISOPTO TEARS / GONIOVISC) 2.5 % ophthalmic solution Place 1 drop into the right eye 3 (three) times daily.    Marland Kitchen levothyroxine (SYNTHROID, LEVOTHROID) 100 MCG tablet Take 100 mcg by mouth daily.    Marland Kitchen LORazepam (ATIVAN) 1 MG tablet Take 2 mg by mouth at bedtime.    . metoprolol succinate (TOPROL-XL) 100 MG 24 hr tablet Take 100 mg by mouth every evening.    . mometasone (ASMANEX) 220 MCG/INH inhaler Inhale 2 puffs into the lungs at bedtime.    . pantoprazole (PROTONIX) 40 MG tablet Take 40 mg by mouth 2 (two) times daily.    . ranitidine (ZANTAC) 300 MG tablet Take 300 mg by mouth at bedtime.    . simvastatin (ZOCOR) 10 MG tablet Take 10 mg by mouth at bedtime.    Marland Kitchen tiotropium (SPIRIVA) 18 MCG inhalation capsule Place 18 mcg into inhaler and inhale at bedtime.     Marland Kitchen warfarin (COUMADIN) 3 MG tablet Take 1.5-3 mg by mouth See admin instructions. Take 1 tablet on Sunday and Tuesday then take 1/2 tablet all the other days      Results for  orders placed or performed during the hospital encounter of 11/06/17 (from the past 48 hour(s))  Protime-INR     Status: Abnormal   Collection Time: 11/06/17  6:39 PM  Result Value Ref Range   Prothrombin Time 26.0 (H) 11.4 - 15.2 seconds   INR 2.41   CBC     Status: Abnormal   Collection Time: 11/06/17  6:39 PM  Result Value Ref Range   WBC 6.0 4.0 - 10.5 K/uL   RBC 4.03 (L) 4.22 - 5.81 MIL/uL   Hemoglobin 11.8 (L) 13.0 - 17.0 g/dL   HCT 37.7 (L) 39.0 - 52.0 %   MCV 93.5 78.0 - 100.0 fL   MCH 29.3 26.0 - 34.0 pg   MCHC 31.3 30.0 - 36.0 g/dL   RDW 14.4 11.5 - 15.5 %   Platelets 139 (L) 150 - 400 K/uL  Comprehensive metabolic panel     Status: Abnormal   Collection Time: 11/06/17  6:39 PM  Result Value Ref Range   Sodium 138 135 - 145  mmol/L   Potassium 4.1 3.5 - 5.1 mmol/L   Chloride 103 101 - 111 mmol/L   CO2 29 22 - 32 mmol/L   Glucose, Bld 96 65 - 99 mg/dL   BUN 20 6 - 20 mg/dL   Creatinine, Ser 1.19 0.61 - 1.24 mg/dL   Calcium 8.8 (L) 8.9 - 10.3 mg/dL   Total Protein 6.6 6.5 - 8.1 g/dL   Albumin 3.6 3.5 - 5.0 g/dL   AST 33 15 - 41 U/L   ALT 24 17 - 63 U/L   Alkaline Phosphatase 72 38 - 126 U/L   Total Bilirubin 0.8 0.3 - 1.2 mg/dL   GFR calc non Af Amer 57 (L) >60 mL/min   GFR calc Af Amer >60 >60 mL/min    Comment: (NOTE) The eGFR has been calculated using the CKD EPI equation. This calculation has not been validated in all clinical situations. eGFR's persistently <60 mL/min signify possible Chronic Kidney Disease.    Anion gap 6 5 - 15  Urinalysis, Routine w reflex microscopic     Status: Abnormal   Collection Time: 11/06/17  9:16 PM  Result Value Ref Range   Color, Urine YELLOW YELLOW   APPearance CLEAR CLEAR   Specific Gravity, Urine 1.014 1.005 - 1.030   pH 5.0 5.0 - 8.0   Glucose, UA NEGATIVE NEGATIVE mg/dL   Hgb urine dipstick LARGE (A) NEGATIVE   Bilirubin Urine NEGATIVE NEGATIVE   Ketones, ur 5 (A) NEGATIVE mg/dL   Protein, ur NEGATIVE NEGATIVE mg/dL   Nitrite NEGATIVE NEGATIVE   Leukocytes, UA NEGATIVE NEGATIVE   RBC / HPF 6-30 0 - 5 RBC/hpf   WBC, UA 0-5 0 - 5 WBC/hpf   Bacteria, UA NONE SEEN NONE SEEN   Squamous Epithelial / LPF NONE SEEN NONE SEEN   Mucus PRESENT   Basic metabolic panel     Status: Abnormal   Collection Time: 11/07/17  5:11 AM  Result Value Ref Range   Sodium 138 135 - 145 mmol/L   Potassium 3.8 3.5 - 5.1 mmol/L   Chloride 101 101 - 111 mmol/L   CO2 30 22 - 32 mmol/L   Glucose, Bld 122 (H) 65 - 99 mg/dL   BUN 16 6 - 20 mg/dL   Creatinine, Ser 0.99 0.61 - 1.24 mg/dL   Calcium 8.4 (L) 8.9 - 10.3 mg/dL   GFR calc non Af Amer >60 >60 mL/min   GFR calc Af  Amer >60 >60 mL/min    Comment: (NOTE) The eGFR has been calculated using the CKD EPI equation. This  calculation has not been validated in all clinical situations. eGFR's persistently <60 mL/min signify possible Chronic Kidney Disease.    Anion gap 7 5 - 15  CBC     Status: Abnormal   Collection Time: 11/07/17  5:11 AM  Result Value Ref Range   WBC 5.6 4.0 - 10.5 K/uL   RBC 3.70 (L) 4.22 - 5.81 MIL/uL   Hemoglobin 11.0 (L) 13.0 - 17.0 g/dL   HCT 34.8 (L) 39.0 - 52.0 %   MCV 94.1 78.0 - 100.0 fL   MCH 29.7 26.0 - 34.0 pg   MCHC 31.6 30.0 - 36.0 g/dL   RDW 14.6 11.5 - 15.5 %   Platelets 126 (L) 150 - 400 K/uL   Dg Shoulder Right  Result Date: 11/06/2017 CLINICAL DATA:  Right shoulder pain since falling down escalator today. EXAM: RIGHT SHOULDER - 2+ VIEW COMPARISON:  None. FINDINGS: AP and Y-views were obtained. Patient could not tolerate an axillary view. No evidence of acute fracture or dislocation. The subacromial space is preserved. There are mild acromioclavicular and glenohumeral degenerative changes. Patient has an AICD. IMPRESSION: No acute osseous findings. Electronically Signed   By: Richardean Sale M.D.   On: 11/06/2017 21:02   Ct Head Wo Contrast  Result Date: 11/06/2017 CLINICAL DATA:  Right-sided head and neck pain after falling down escalator today. Loss of consciousness. EXAM: CT HEAD WITHOUT CONTRAST CT CERVICAL SPINE WITHOUT CONTRAST TECHNIQUE: Multidetector CT imaging of the head and cervical spine was performed following the standard protocol without intravenous contrast. Multiplanar CT image reconstructions of the cervical spine were also generated. COMPARISON:  None. FINDINGS: CT HEAD FINDINGS Brain: There is no evidence of acute intracranial hemorrhage, mass lesion, brain edema or extra-axial fluid collection. The ventricles and subarachnoid spaces are appropriately sized for age. There is no CT evidence of acute cortical infarction. Vascular:  No hyperdense vessel identified. Skull: Negative for fracture or focal lesion. Sinuses/Orbits: There is right periorbital soft  tissue swelling associated with multiple acute right-sided facial fractures. There is a depressed and mildly comminuted fracture of the right zygomatic arch. There are fractures of the right orbital floor and lateral wall. There are fractures of the anterior and lateral walls the right maxilla. No left-sided facial fractures are seen. The globes are intact. There is some soft tissue emphysema in the right malar region. There is mucosal thickening and/or blood in the ethmoid and right maxillary and frontal sinuses. Other: None. CT CERVICAL SPINE FINDINGS Alignment: 2 mm of retrolisthesis at C3-4.  Otherwise normal. Skull base and vertebrae: No evidence of acute cervical spine fracture or traumatic subluxation. However, there is a possible nondisplaced fracture of the T2 spinous process. There is multilevel cervical spondylosis with ankylosis across the C2-3 disc and facet joints. Moderate endplate degenerative changes are present at C3-4. Soft tissues and spinal canal: No prevertebral fluid or swelling. No visible canal hematoma. Disc levels: As above, multilevel spondylosis. There is severe right foraminal narrowing at C3-4 secondary to uncinate spurring and facet hypertrophy. There mild-to-moderate left greater than right foraminal narrowing at C4-5 and C5-6. Upper chest: No acute findings. Other: Carotid atherosclerosis. IMPRESSION: 1. No evidence of acute intracranial or calvarial injury. 2. Multiple right-sided facial fractures (tripod fracture) as described. No evidence of orbital hematoma. There blood in the paranasal sinuses. 3. No evidence of acute cervical spine fracture, traumatic subluxation or  static signs of instability. Possible nondisplaced fracture of the T2 spinous process. 4. Multilevel cervical spondylosis. Electronically Signed   By: Richardean Sale M.D.   On: 11/06/2017 19:46   Ct Cervical Spine Wo Contrast  Result Date: 11/06/2017 CLINICAL DATA:  Right-sided head and neck pain after falling  down escalator today. Loss of consciousness. EXAM: CT HEAD WITHOUT CONTRAST CT CERVICAL SPINE WITHOUT CONTRAST TECHNIQUE: Multidetector CT imaging of the head and cervical spine was performed following the standard protocol without intravenous contrast. Multiplanar CT image reconstructions of the cervical spine were also generated. COMPARISON:  None. FINDINGS: CT HEAD FINDINGS Brain: There is no evidence of acute intracranial hemorrhage, mass lesion, brain edema or extra-axial fluid collection. The ventricles and subarachnoid spaces are appropriately sized for age. There is no CT evidence of acute cortical infarction. Vascular:  No hyperdense vessel identified. Skull: Negative for fracture or focal lesion. Sinuses/Orbits: There is right periorbital soft tissue swelling associated with multiple acute right-sided facial fractures. There is a depressed and mildly comminuted fracture of the right zygomatic arch. There are fractures of the right orbital floor and lateral wall. There are fractures of the anterior and lateral walls the right maxilla. No left-sided facial fractures are seen. The globes are intact. There is some soft tissue emphysema in the right malar region. There is mucosal thickening and/or blood in the ethmoid and right maxillary and frontal sinuses. Other: None. CT CERVICAL SPINE FINDINGS Alignment: 2 mm of retrolisthesis at C3-4.  Otherwise normal. Skull base and vertebrae: No evidence of acute cervical spine fracture or traumatic subluxation. However, there is a possible nondisplaced fracture of the T2 spinous process. There is multilevel cervical spondylosis with ankylosis across the C2-3 disc and facet joints. Moderate endplate degenerative changes are present at C3-4. Soft tissues and spinal canal: No prevertebral fluid or swelling. No visible canal hematoma. Disc levels: As above, multilevel spondylosis. There is severe right foraminal narrowing at C3-4 secondary to uncinate spurring and facet  hypertrophy. There mild-to-moderate left greater than right foraminal narrowing at C4-5 and C5-6. Upper chest: No acute findings. Other: Carotid atherosclerosis. IMPRESSION: 1. No evidence of acute intracranial or calvarial injury. 2. Multiple right-sided facial fractures (tripod fracture) as described. No evidence of orbital hematoma. There blood in the paranasal sinuses. 3. No evidence of acute cervical spine fracture, traumatic subluxation or static signs of instability. Possible nondisplaced fracture of the T2 spinous process. 4. Multilevel cervical spondylosis. Electronically Signed   By: Richardean Sale M.D.   On: 11/06/2017 19:46   Dg Chest Portable 1 View  Result Date: 11/06/2017 CLINICAL DATA:  Right-sided chest pain after falling down escalator steps today. Initial encounter. EXAM: PORTABLE CHEST 1 VIEW COMPARISON:  None. FINDINGS: 1841 hr. Left subclavian AICD with multiple leads extending into the right atrium and right ventricle. Some of these are likely remnants of previous pacemakers. The heart size is normal. There is mild aortic and great vessel tortuosity. There is elevation of the right hemidiaphragm with mild right basilar atelectasis. No pneumothorax or significant pleural effusion identified. No acute rib fractures are seen. IMPRESSION: No definite acute findings. Elevated right hemidiaphragm with right basilar atelectasis. Electronically Signed   By: Richardean Sale M.D.   On: 11/06/2017 19:03   Ct Maxillofacial Wo Contrast  Result Date: 11/07/2017 CLINICAL DATA:  Fall.  Right face lacerations. EXAM: CT MAXILLOFACIAL WITHOUT CONTRAST TECHNIQUE: Multidetector CT imaging of the maxillofacial structures was performed. Multiplanar CT image reconstructions were also generated. COMPARISON:  Head CT performed  today. FINDINGS: Osseous: Fracture through the right zygomatic arch, lateral wall of the right orbit, and orbital floor. No evidence of entrapment or herniation of orbital contents into  the right maxillary sinus. Fractures through the anterior and lateral walls of the right maxillary sinus. No mandibular fracture. Orbits: Right tripod fracture as described above. Soft tissue swelling anterior to the right orbit. No intraorbital hematoma. Globe is intact. Sinuses: Layering blood in the right maxillary sinus. Opacification of scattered ethmoid air cells. Soft tissues: Soft tissue swelling over the right face. Limited intracranial: No acute findings IMPRESSION: Right tripod fracture. Fractures through the anterior and lateral wall of the right maxillary sinus. No evidence of entrapment. Blood within the right maxillary sinus. Electronically Signed   By: Rolm Baptise M.D.   On: 11/07/2017 10:10     Blood pressure 105/64, pulse 70, temperature 97.8 F (36.6 C), temperature source Oral, resp. rate 18, height _0  (1.93 m), weight 99.6 kg (219 lb 9.3 oz), SpO2 100 %.  PHYSICAL EXAM: Overall appearance:  Old blood, multiple scalp and facial lacerations and excoriations.  RIGHT periorbital swelling with ecchymosis.   Head:  See above Ears:  Not examined Nose:  Straight externally Oral Cavity:  Not examined Oral Pharynx/Hypopharynx/Larynx: not examined Neuro:  Grossly intact Neck: clear  Studies Reviewed: maxillofacial CT, 10 JAN 19    Assessment/Plan Mod displaced RIGHT trimalar zygomatic fx.  Minimal orbital floor involvement.  Impact may be cosmetic, or related to pain/trismus with eating.    Plan:  Ice x 24 hrs, elevation x 3-4 nights, no nose blowing x 2 weeks,  Antibiotics x 7 days.  Wound hygiene.  He needs to see his Ophthalmologist early next week.  Recheck my office 5-6 days for re-evaluation.     Jodi Marble 6/72/2773, 6:52 PM

## 2017-11-07 NOTE — Evaluation (Signed)
Physical Therapy Evaluation Patient Details Name: Devon Jennings MRN: [id number] DOB: 04-20-1941 Today's Date: 11/07/2017   History of Present Illness  Pt is a 77 y/o male [place]. Imaging revealed R maxillary fracture, and possible nondisplaced fracture of T2 spinous process. Other imaging of the head and neck, negative for acute abnormality. PMH includes a fib, nonischemic cardiomyopathy, and s/p ICD and pacemaker.   Clinical Impression  Pt is admitted secondary to problem above with deficits below. PTA, pt was using rollator for ambulation for longer distances. Upon eval, pt presenting with facial pain, decreased balance, and weakness. Oxygen sats decreased to 70% on RA, however, monitor demonstrating unreliable waveform. Required seated rest and 2L of oxygen and sats quickly increased. Reports wife will be able to assist at d/c. Recommending RW to increase safety with mobility and home and HHPT to address mobility deficits. Will continue to follow acutely to maximize functional mobility independence and safety.     Follow Up Recommendations Home health PT;Supervision for mobility/OOB    Equipment Recommendations  Rolling walker with 5" wheels    Recommendations for Other Services OT consult     Precautions / Restrictions Precautions Precautions: Fall Precaution Comments: Reports 3 falls in the past two week. Reports he is unsure if he is blacking out during falls.  Restrictions Weight Bearing Restrictions: No      Mobility  Bed Mobility Overal bed mobility: Modified Independent             General bed mobility comments: Increased time, but no assist required.  Transfers Overall transfer level: Needs assistance Equipment used: Rolling walker (2 wheeled) Transfers: Sit to/from Stand Sit to Stand: Min assist         General transfer comment: Min A for lift assist and steadying assist. Verbal cues for safe hand placement.    Ambulation/Gait Ambulation/Gait assistance: Min guard Ambulation Distance (Feet): 15 Feet Assistive device: Rolling walker (2 wheeled) Gait Pattern/deviations: Step-through pattern;Decreased stride length Gait velocity: Decreased  Gait velocity interpretation: Below normal speed for age/gender General Gait Details: Slow, overall steady gait with use of RW. Attempted ambulation on RA, however, sats dropped to 70%. Monitor did not show good waveform, so unsure of how accurate reading was. Pt did not demonstrate respiratory distress or SOB. With seated rest and 2L of oxygen, pt's sats quickly increased to >90%. Educated to use RW instead of rollator to increase safety with ambulation.   Stairs            Wheelchair Mobility    Modified Rankin (Stroke Patients Only)       Balance Overall balance assessment: Needs assistance Sitting-balance support: No upper extremity supported;Feet supported Sitting balance-Leahy Scale: Good     Standing balance support: Bilateral upper extremity supported;During functional activity Standing balance-Leahy Scale: Poor Standing balance comment: Reliant on BUE support.                              Pertinent Vitals/Pain Pain Assessment: 0-10 Pain Score: 7  Pain Location: face  Pain Descriptors / Indicators: Aching;Constant Pain Intervention(s): Limited activity within patient's tolerance;Monitored during session;Repositioned    Home Living Family/patient expects to be discharged to:: Private residence Living Arrangements: Spouse/significant other Available Help at Discharge: Family;Available 24 hours/day Type of Home: House Home Access: Stairs to enter Entrance Stairs-Rails: None Entrance Stairs-Number of Steps: 2 Home Layout: One level Home Equipment: Bedside commode;Walker - 4  wheels Additional Comments: Reports he uses oxygen at night and sometimes throughout the day.     Prior Function Level of Independence:  Independent with assistive device(s)         Comments: Reports for longer distances and when he was walking at the [facility] he uses rollator      Hand Dominance        Extremity/Trunk Assessment   Upper Extremity Assessment Upper Extremity Assessment: Defer to OT evaluation    Lower Extremity Assessment Lower Extremity Assessment: Generalized weakness    Cervical / Trunk Assessment Cervical / Trunk Assessment: Kyphotic  Communication   Communication: No difficulties  Cognition Arousal/Alertness: Awake/alert Behavior During Therapy: WFL for tasks assessed/performed Overall Cognitive Status: Within Functional Limits for tasks assessed                                        General Comments General comments (skin integrity, edema, etc.): Pt's wife present at end of session. Educated about HHPT recommendations and pt and wife agreeable.     Exercises     Assessment/Plan    PT Assessment Patient needs continued PT services  PT Problem List Decreased strength;Decreased activity tolerance;Decreased balance;Decreased mobility;Cardiopulmonary status limiting activity;Decreased knowledge of use of DME;Pain       PT Treatment Interventions DME instruction;Gait training;Stair training;Functional mobility training;Therapeutic exercise;Balance training;Therapeutic activities;Neuromuscular re-education;Patient/family education    PT Goals (Current goals can be found in the Care Plan section)  Acute Rehab PT Goals Patient Stated Goal: to figure out why i'm falling  PT Goal Formulation: With patient Time For Goal Achievement: 11/21/17 Potential to Achieve Goals: Good    Frequency Min 3X/week   Barriers to discharge        Co-evaluation               AM-PAC PT "6 Clicks" Daily Activity  Outcome Measure Difficulty turning over in bed (including adjusting bedclothes, sheets and blankets)?: A Little Difficulty moving from lying on back to sitting on the  side of the bed? : A Little Difficulty sitting down on and standing up from a chair with arms (e.g., wheelchair, bedside commode, etc,.)?: Unable Help needed moving to and from a bed to chair (including a wheelchair)?: A Little Help needed walking in hospital room?: A Little Help needed climbing 3-5 steps with a railing? : A Lot 6 Click Score: 15    End of Session Equipment Utilized During Treatment: Gait belt;Oxygen Activity Tolerance: Treatment limited secondary to medical complications (Comment)(decreased oxygen sats ) Patient left: in bed;with call bell/phone within reach;with nursing/sitter in room;with family/visitor present Nurse Communication: Mobility status;Other (comment)(oxygen sats. ) PT Visit Diagnosis: Unsteadiness on feet (R26.81);Muscle weakness (generalized) (M62.81)    Time: 0160-1093 PT Time Calculation (min) (ACUTE ONLY): 30 min   Charges:   PT Evaluation $PT Eval Moderate Complexity: 1 Mod PT Treatments $Gait Training: 8-22 mins   PT G Codes:        Gladys Damme, PT, DPT  Acute Rehabilitation Services  Pager: [phone]   Lehman Prom 11/07/2017, 4:11 PM

## 2017-11-07 NOTE — Progress Notes (Signed)
Devon Jennings is a 77 y.o. male patient admitted from ED awake, alert - oriented  X 4 - no acute distress noted.  VSS - Blood pressure (!) 93/53, pulse 71, temperature 97.8 F (36.6 C), temperature source Oral, resp. rate 18, height 6\' 4"  (1.93 m), weight 104.3 kg (230 lb), SpO2 100 %.    IV in place, occlusive dsg intact without redness.  Orientation to room, and floor completed with information packet given to patient/family.  Patient declined safety video at this time.  Admission INP armband ID verified with patient/family, and in place.   SR up x 2, fall assessment complete, with patient and family able to verbalize understanding of risk associated with falls, and verbalized understanding to call nsg before up out of bed.  Call light within reach, patient able to voice, and demonstrate understanding.  Pt has two lacerations on his head, multiple abrasions on his face, arms, hands, and legs.   Will cont to eval and treat per MD orders.  Theodosia Blender, RN 11/07/2017 3:30 PM

## 2017-11-08 DIAGNOSIS — S02401A Maxillary fracture, unspecified, initial encounter for closed fracture: Secondary | ICD-10-CM | POA: Diagnosis not present

## 2017-11-08 DIAGNOSIS — I482 Chronic atrial fibrillation: Secondary | ICD-10-CM | POA: Diagnosis not present

## 2017-11-08 DIAGNOSIS — S22008A Other fracture of unspecified thoracic vertebra, initial encounter for closed fracture: Secondary | ICD-10-CM | POA: Diagnosis not present

## 2017-11-08 LAB — PROTIME-INR
INR: 2.75
PROTHROMBIN TIME: 28.8 s — AB (ref 11.4–15.2)

## 2017-11-08 MED ORDER — CEPHALEXIN 250 MG PO CAPS: 250.0000 mg | ORAL_CAPSULE | Freq: Four times a day (QID) | ORAL | 0 refills | Status: AC

## 2017-11-08 MED ORDER — OXYCODONE HCL 5 MG PO TABS: 5.0000 mg | ORAL_TABLET | Freq: Four times a day (QID) | ORAL | 0 refills | Status: AC | PRN

## 2017-11-08 NOTE — Discharge Summary (Signed)
Physician Discharge Summary  Devon Jennings NFA:[id number] DOB: 1941/03/18 DOA: 11/06/2017  PCP: Rubbie Battiest, MD  Admit date: 11/06/2017 Discharge date: 11/08/2017  Admitted From: Home Disposition:  Home Recommendations for Outpatient Follow-up:  1) Follow up with ENT as an outpatient in a week.  Name and number of the provider has been attached. 2) Follow up with your ophthalmologist in a week. 3) Take prescribed medications as instructed. 4)Apply Ice on the swollen area of face, elevation of face for next  3-4 nights.Donot blow nose for 2 weeks.  Home Health: yes Equipment/Devices: Agricultural consultant  Discharge Condition:Stable CODE STATUS: Full Diet recommendation: Heart Healthy   Brief/Interim Summary:    Patient is a 77 year old male with past medical history of atrial fibrillation, nonischemic cardiomyopathy status post ICD placement, hypothyroidism, hyperlipidemia who [place]   He immediately developed facial bruise with pain and also lower extremity pain.  In the emergency department he underwent CT of the head and C-spine which showed right-sided multiple fracture of facial bones and also possible fracture of T2 spinous process. Patient was evaluated by trauma surgery and ENT. There is no plan for any further intervention.  Patient is being discharged to home today.  He will follow-up with ENT and ophthalmology as an outpatient.   Fall with facial fractures: CT of the face showed facial fractures.  CT-spine also showed possible T2 spinous process fracture.  Trauma surgery was following.  No plan for further intervention.  Recommended cold compression, local wound care. Dr. Lazarus Salines of ENT  to the patient.  Recommended antibiotics for 1 week. Continue pain medications. CT maxillofacial: Right tripod fracture. Fractures through the anterior and lateral wall of the right maxillary sinus. No evidence of entrapment. Blood within the  right maxillary sinus  PT/OT evaluated   the patient and recommended home health.  He should follow-up with ENT and ophthalmology in a week.  Atrial fibrillation: Chronic.  Currently in normal sinus rhythm.  On amiodarone and metoprolol which we will continue.   We will resume coumadin on discharge  Nonischemic cardiomyopathy: Status post AICD.  Currently stable  Hypothyroidism: On Synthyroid  Normocytic normochromic anemia/thrombocytopenia: Currently stable.   Chronic constipation: Bowel regimen ordered.      Discharge Diagnoses:  Principal Problem:   Fall Active Problems:   Atrial fibrillation, chronic (HCC)   NICM (nonischemic cardiomyopathy) (HCC)   Hypothyroidism    Discharge Instructions  Discharge Instructions    Diet - low sodium heart healthy   Complete by:  As directed    Discharge instructions   Complete by:  As directed    1) Follow up with ENT as an outpatient in a week.  Name and number of the provider has been attached. 2) Follow up with your ophthalmologist in a week. 3) Take prescribed medications as instructed. 4)Apply Ice on the swollen area of face, elevation of face for next  3-4 nights.Donot blow nose for 2 weeks.   Increase activity slowly   Complete by:  As directed      Allergies as of 11/08/2017      Reactions   Penicillins Hives   Sulfa Antibiotics Rash      Medication List    TAKE these medications   amiodarone 200 MG tablet Commonly known as:  PACERONE Take 200 mg by mouth every evening.   brimonidine 0.15 % ophthalmic solution Commonly known as:  ALPHAGAN Place 1 drop into the left eye 3 (three) times  daily.   cephALEXin 250 MG capsule Commonly known as:  KEFLEX Take 1 capsule (250 mg total) by mouth every 6 (six) hours.   escitalopram 5 MG tablet Commonly known as:  LEXAPRO Take 5 mg by mouth daily.   fluticasone 50 MCG/ACT nasal spray Commonly known as:  FLONASE Place 1 spray into the nose daily as needed for  allergies.   hydroxypropyl methylcellulose / hypromellose 2.5 % ophthalmic solution Commonly known as:  ISOPTO TEARS / GONIOVISC Place 1 drop into the right eye 3 (three) times daily.   levothyroxine 100 MCG tablet Commonly known as:  SYNTHROID, LEVOTHROID Take 100 mcg by mouth daily.   LORazepam 1 MG tablet Commonly known as:  ATIVAN Take 2 mg by mouth at bedtime.   metoprolol succinate 100 MG 24 hr tablet Commonly known as:  TOPROL-XL Take 100 mg by mouth every evening.   mometasone 220 MCG/INH inhaler Commonly known as:  ASMANEX Inhale 2 puffs into the lungs at bedtime.   oxyCODONE 5 MG immediate release tablet Commonly known as:  Oxy IR/ROXICODONE Take 1-2 tablets (5-10 mg total) by mouth every 6 (six) hours as needed for moderate pain or severe pain (5 mg for moderate pain, 10 mg for severe pain).   pantoprazole 40 MG tablet Commonly known as:  PROTONIX Take 40 mg by mouth 2 (two) times daily.   ranitidine 300 MG tablet Commonly known as:  ZANTAC Take 300 mg by mouth at bedtime.   simvastatin 10 MG tablet Commonly known as:  ZOCOR Take 10 mg by mouth at bedtime.   tiotropium 18 MCG inhalation capsule Commonly known as:  SPIRIVA Place 18 mcg into inhaler and inhale at bedtime.   Vitamin D3 2000 units capsule Take 2,000 Units by mouth 2 (two) times daily.   warfarin 3 MG tablet Commonly known as:  COUMADIN Take 1.5-3 mg by mouth See admin instructions. Take 1 tablet on Sunday and Tuesday then take 1/2 tablet all the other days      Follow-up Information    Rubbie Battiest, MD. Schedule an appointment as soon as possible for a visit in 1 week(s).   Specialty:  Family Medicine Contact information: [address] [place] [address] [phone]        Flo Shanks, MD. Schedule an appointment as soon as possible for a visit in 1 week(s).   Specialty:  Otolaryngology Contact information: [address] [place]  [address] [phone]          Allergies  Allergen Reactions  . Penicillins Hives  . Sulfa Antibiotics Rash    Consultations:  ENT, trauma surgery   Procedures/Studies: Dg Shoulder Right  Result Date: 11/06/2017 CLINICAL DATA:  Right shoulder pain since falling down escalator today. EXAM: RIGHT SHOULDER - 2+ VIEW COMPARISON:  None. FINDINGS: AP and Y-views were obtained. Patient could not tolerate an axillary view. No evidence of acute fracture or dislocation. The subacromial space is preserved. There are mild acromioclavicular and glenohumeral degenerative changes. Patient has an AICD. IMPRESSION: No acute osseous findings. Electronically Signed   By: Carey Bullocks M.D.   On: 11/06/2017 21:02   Ct Head Wo Contrast  Result Date: 11/06/2017 CLINICAL DATA:  Right-sided head and neck pain after falling down escalator today. Loss of consciousness. EXAM: CT HEAD WITHOUT CONTRAST CT CERVICAL SPINE WITHOUT CONTRAST TECHNIQUE: Multidetector CT imaging of the head and cervical spine was performed following the standard protocol without intravenous contrast. Multiplanar CT image  reconstructions of the cervical spine were also generated. COMPARISON:  None. FINDINGS: CT HEAD FINDINGS Brain: There is no evidence of acute intracranial hemorrhage, mass lesion, brain edema or extra-axial fluid collection. The ventricles and subarachnoid spaces are appropriately sized for age. There is no CT evidence of acute cortical infarction. Vascular:  No hyperdense vessel identified. Skull: Negative for fracture or focal lesion. Sinuses/Orbits: There is right periorbital soft tissue swelling associated with multiple acute right-sided facial fractures. There is a depressed and mildly comminuted fracture of the right zygomatic arch. There are fractures of the right orbital floor and lateral wall. There are fractures of the anterior and lateral walls the right maxilla. No left-sided facial fractures are seen. The globes are  intact. There is some soft tissue emphysema in the right malar region. There is mucosal thickening and/or blood in the ethmoid and right maxillary and frontal sinuses. Other: None. CT CERVICAL SPINE FINDINGS Alignment: 2 mm of retrolisthesis at C3-4.  Otherwise normal. Skull base and vertebrae: No evidence of acute cervical spine fracture or traumatic subluxation. However, there is a possible nondisplaced fracture of the T2 spinous process. There is multilevel cervical spondylosis with ankylosis across the C2-3 disc and facet joints. Moderate endplate degenerative changes are present at C3-4. Soft tissues and spinal canal: No prevertebral fluid or swelling. No visible canal hematoma. Disc levels: As above, multilevel spondylosis. There is severe right foraminal narrowing at C3-4 secondary to uncinate spurring and facet hypertrophy. There mild-to-moderate left greater than right foraminal narrowing at C4-5 and C5-6. Upper chest: No acute findings. Other: Carotid atherosclerosis. IMPRESSION: 1. No evidence of acute intracranial or calvarial injury. 2. Multiple right-sided facial fractures (tripod fracture) as described. No evidence of orbital hematoma. There blood in the paranasal sinuses. 3. No evidence of acute cervical spine fracture, traumatic subluxation or static signs of instability. Possible nondisplaced fracture of the T2 spinous process. 4. Multilevel cervical spondylosis. Electronically Signed   By: Carey Bullocks M.D.   On: 11/06/2017 19:46   Ct Cervical Spine Wo Contrast  Result Date: 11/06/2017 CLINICAL DATA:  Right-sided head and neck pain after falling down escalator today. Loss of consciousness. EXAM: CT HEAD WITHOUT CONTRAST CT CERVICAL SPINE WITHOUT CONTRAST TECHNIQUE: Multidetector CT imaging of the head and cervical spine was performed following the standard protocol without intravenous contrast. Multiplanar CT image reconstructions of the cervical spine were also generated. COMPARISON:  None.  FINDINGS: CT HEAD FINDINGS Brain: There is no evidence of acute intracranial hemorrhage, mass lesion, brain edema or extra-axial fluid collection. The ventricles and subarachnoid spaces are appropriately sized for age. There is no CT evidence of acute cortical infarction. Vascular:  No hyperdense vessel identified. Skull: Negative for fracture or focal lesion. Sinuses/Orbits: There is right periorbital soft tissue swelling associated with multiple acute right-sided facial fractures. There is a depressed and mildly comminuted fracture of the right zygomatic arch. There are fractures of the right orbital floor and lateral wall. There are fractures of the anterior and lateral walls the right maxilla. No left-sided facial fractures are seen. The globes are intact. There is some soft tissue emphysema in the right malar region. There is mucosal thickening and/or blood in the ethmoid and right maxillary and frontal sinuses. Other: None. CT CERVICAL SPINE FINDINGS Alignment: 2 mm of retrolisthesis at C3-4.  Otherwise normal. Skull base and vertebrae: No evidence of acute cervical spine fracture or traumatic subluxation. However, there is a possible nondisplaced fracture of the T2 spinous process. There is multilevel cervical spondylosis  with ankylosis across the C2-3 disc and facet joints. Moderate endplate degenerative changes are present at C3-4. Soft tissues and spinal canal: No prevertebral fluid or swelling. No visible canal hematoma. Disc levels: As above, multilevel spondylosis. There is severe right foraminal narrowing at C3-4 secondary to uncinate spurring and facet hypertrophy. There mild-to-moderate left greater than right foraminal narrowing at C4-5 and C5-6. Upper chest: No acute findings. Other: Carotid atherosclerosis. IMPRESSION: 1. No evidence of acute intracranial or calvarial injury. 2. Multiple right-sided facial fractures (tripod fracture) as described. No evidence of orbital hematoma. There blood in  the paranasal sinuses. 3. No evidence of acute cervical spine fracture, traumatic subluxation or static signs of instability. Possible nondisplaced fracture of the T2 spinous process. 4. Multilevel cervical spondylosis. Electronically Signed   By: Carey Bullocks M.D.   On: 11/06/2017 19:46   Dg Chest Portable 1 View  Result Date: 11/06/2017 CLINICAL DATA:  Right-sided chest pain after falling down escalator steps today. Initial encounter. EXAM: PORTABLE CHEST 1 VIEW COMPARISON:  None. FINDINGS: 1841 hr. Left subclavian AICD with multiple leads extending into the right atrium and right ventricle. Some of these are likely remnants of previous pacemakers. The heart size is normal. There is mild aortic and great vessel tortuosity. There is elevation of the right hemidiaphragm with mild right basilar atelectasis. No pneumothorax or significant pleural effusion identified. No acute rib fractures are seen. IMPRESSION: No definite acute findings. Elevated right hemidiaphragm with right basilar atelectasis. Electronically Signed   By: Carey Bullocks M.D.   On: 11/06/2017 19:03   Ct Maxillofacial Wo Contrast  Result Date: 11/07/2017 CLINICAL DATA:  Fall.  Right face lacerations. EXAM: CT MAXILLOFACIAL WITHOUT CONTRAST TECHNIQUE: Multidetector CT imaging of the maxillofacial structures was performed. Multiplanar CT image reconstructions were also generated. COMPARISON:  Head CT performed today. FINDINGS: Osseous: Fracture through the right zygomatic arch, lateral wall of the right orbit, and orbital floor. No evidence of entrapment or herniation of orbital contents into the right maxillary sinus. Fractures through the anterior and lateral walls of the right maxillary sinus. No mandibular fracture. Orbits: Right tripod fracture as described above. Soft tissue swelling anterior to the right orbit. No intraorbital hematoma. Globe is intact. Sinuses: Layering blood in the right maxillary sinus. Opacification of scattered  ethmoid air cells. Soft tissues: Soft tissue swelling over the right face. Limited intracranial: No acute findings IMPRESSION: Right tripod fracture. Fractures through the anterior and lateral wall of the right maxillary sinus. No evidence of entrapment. Blood within the right maxillary sinus. Electronically Signed   By: Charlett Nose M.D.   On: 11/07/2017 10:10       Subjective: Patient seen and examined the bedside this morning.  Remains comfortable .No new events/issues.   Discharge Exam: Vitals:   11/08/17 0444 11/08/17 0445  BP: (!) 102/51   Pulse: 70   Resp: 18   Temp: 97.7 F (36.5 C)   SpO2: 91% 100%   Vitals:   11/07/17 1753 11/07/17 2100 11/08/17 0444 11/08/17 0445  BP: 105/64 (!) 94/50 (!) 102/51   Pulse: 70 70 70   Resp:  18 18   Temp:  98.4 F (36.9 C) 97.7 F (36.5 C)   TempSrc:  Oral Oral   SpO2:  100% 91% 100%  Weight:      Height:        General: Pt is alert, awake, not in acute distress HEENT: Bruises on the right face, swelling of the periorbital area on the right  Cardiovascular: RRR, S1/S2 +, no rubs, no gallops Respiratory: CTA bilaterally, no wheezing, no rhonchi Abdominal: Soft, NT, ND, bowel sounds  Extremities: no edema, no cyanosis    The results of significant diagnostics from this hospitalization (including imaging, microbiology, ancillary and laboratory) are listed below for reference.     Microbiology: No results found for this or any previous visit (from the past 240 hour(s)).   Labs: BNP (last 3 results) No results for input(s): BNP in the last 8760 hours. Basic Metabolic Panel: Recent Labs  Lab 11/06/17 1839 11/07/17 0511  NA 138 138  K 4.1 3.8  CL 103 101  CO2 29 30  GLUCOSE 96 122*  BUN 20 16  CREATININE 1.19 0.99  CALCIUM 8.8* 8.4*   Liver Function Tests: Recent Labs  Lab 11/06/17 1839  AST 33  ALT 24  ALKPHOS 72  BILITOT 0.8  PROT 6.6  ALBUMIN 3.6   No results for input(s): LIPASE, AMYLASE in the last 168  hours. No results for input(s): AMMONIA in the last 168 hours. CBC: Recent Labs  Lab 11/06/17 1839 11/07/17 0511  WBC 6.0 5.6  HGB 11.8* 11.0*  HCT 37.7* 34.8*  MCV 93.5 94.1  PLT 139* 126*   Cardiac Enzymes: No results for input(s): CKTOTAL, CKMB, CKMBINDEX, TROPONINI in the last 168 hours. BNP: Invalid input(s): POCBNP CBG: No results for input(s): GLUCAP in the last 168 hours. D-Dimer No results for input(s): DDIMER in the last 72 hours. Hgb A1c No results for input(s): HGBA1C in the last 72 hours. Lipid Profile No results for input(s): CHOL, HDL, LDLCALC, TRIG, CHOLHDL, LDLDIRECT in the last 72 hours. Thyroid function studies No results for input(s): TSH, T4TOTAL, T3FREE, THYROIDAB in the last 72 hours.  Invalid input(s): FREET3 Anemia work up No results for input(s): VITAMINB12, FOLATE, FERRITIN, TIBC, IRON, RETICCTPCT in the last 72 hours. Urinalysis    Component Value Date/Time   COLORURINE YELLOW 11/06/2017 2116   APPEARANCEUR CLEAR 11/06/2017 2116   LABSPEC 1.014 11/06/2017 2116   PHURINE 5.0 11/06/2017 2116   GLUCOSEU NEGATIVE 11/06/2017 2116   HGBUR LARGE (A) 11/06/2017 2116   BILIRUBINUR NEGATIVE 11/06/2017 2116   KETONESUR 5 (A) 11/06/2017 2116   PROTEINUR NEGATIVE 11/06/2017 2116   NITRITE NEGATIVE 11/06/2017 2116   LEUKOCYTESUR NEGATIVE 11/06/2017 2116   Sepsis Labs Invalid input(s): PROCALCITONIN,  WBC,  LACTICIDVEN Microbiology No results found for this or any previous visit (from the past 240 hour(s)).   Time coordinating discharge: Over 30 minutes  SIGNED:   Meredith Leeds, MD  [facility] 11/08/2017, 12:03 PM Pager [phone]  If 7PM-7AM, please contact night-coverage [email or web address] Password [email or web address]

## 2017-11-08 NOTE — Progress Notes (Signed)
Discharge instructions reviewed with the patient to include medications, follow up visits, prescriptions and care of his abrasions.  Left hand and right knee dressings changed.  To door via wheelchair.  Home via POV with his wife driving.

## 2017-11-08 NOTE — Progress Notes (Signed)
No further recommendations from a trauma standpoint. Patient will need to follow up with Dr. Lazarus Salines, does not need to follow up in the trauma clinic. We will sign off at this time. Please call with any questions or concerns.  Wells Guiles , [facility] Surgery 11/08/2017, 9:06 AM Pager: [phone] Mon-Fri 7:00 am-4:30 pm Sat-Sun 7:00 am-11:30 am

## 2017-11-08 NOTE — Progress Notes (Signed)
Physical Therapy Treatment Patient Details Name: Devon Jennings MRN: [id number] DOB: February 06, 1941 Today's Date: 11/08/2017    History of Present Illness Pt is a 77 y/o male [place] Imaging revealed R maxillary fracture, and possible nondisplaced fracture of T2 spinous process. Other imaging of the head and neck, negative for acute abnormality. PMH includes a fib, nonischemic cardiomyopathy, and s/p ICD and pacemaker.     PT Comments    Patient received in bed, pleasant and willing to participate in PT session this morning. He was sitting in bed on room air, measured at 85% at rest on room air and able to recover to 91% with cues for pursed lip breathing. He is able to complete bed mobility on a Mod(I) basis, however continues to require Min assist for safe functional transfers mostly due to general unsteadiness/fall risk. Patient states at baseline he is actually on 2LPM O2 at home, so O2 was applied for all OOB activities this session with sats generally remaining in the low 90s. Note patient took nasal cannula off after return to bed, and sats immediately dropped to 88%, nasal cannula were replaced and O2 sats returned to 90s. He was left in bed with all needs met and bed alarm activated this morning.      Follow Up Recommendations  Home health PT;Supervision for mobility/OOB     Equipment Recommendations  Rolling walker with 5" wheels    Recommendations for Other Services       Precautions / Restrictions Precautions Precautions: Fall Precaution Comments: Reports 3 falls in the past two week. Reports he is unsure if he is blacking out during falls.  Restrictions Weight Bearing Restrictions: No    Mobility  Bed Mobility Overal bed mobility: Modified Independent             General bed mobility comments: increased time   Transfers Overall transfer level: Needs assistance Equipment used: Rolling walker (2 wheeled) Transfers: Sit  to/from Stand Sit to Stand: Min assist         General transfer comment: assist for standing up out of bed and to steady; patient attempted with just min guard initially but unable to steady enough to complete transfer, posterior LOB   Ambulation/Gait Ambulation/Gait assistance: Min guard Ambulation Distance (Feet): 80 Feet Assistive device: Rolling walker (2 wheeled) Gait Pattern/deviations: Step-through pattern;Decreased stride length Gait velocity: Decreased    General Gait Details: slow but steady, VC for upright posture and to remain inside of RW especially on turns; O2 remained generally in low 90s with gait with 2LPM O2    Stairs            Wheelchair Mobility    Modified Rankin (Stroke Patients Only)       Balance Overall balance assessment: Needs assistance Sitting-balance support: No upper extremity supported;Feet supported Sitting balance-Leahy Scale: Good     Standing balance support: Bilateral upper extremity supported;During functional activity Standing balance-Leahy Scale: Poor Standing balance comment: reliant on BUE support, poor safety awareness                             Cognition Arousal/Alertness: Awake/alert Behavior During Therapy: WFL for tasks assessed/performed Overall Cognitive Status: Within Functional Limits for tasks assessed  Exercises      General Comments General comments (skin integrity, edema, etc.): patient reports he is on 2LPM O2 at baseline, encouraged patient to use his home O2 and follow up with prescribing MD       Pertinent Vitals/Pain Pain Assessment: 0-10 Pain Score: 6  Pain Location: face  Pain Descriptors / Indicators: Aching;Constant Pain Intervention(s): Limited activity within patient's tolerance;Monitored during session    Home Living                      Prior Function            PT Goals (current goals can now be found  in the care plan section) Acute Rehab PT Goals Patient Stated Goal: to figure out why i'm falling  PT Goal Formulation: With patient Time For Goal Achievement: 11/21/17 Potential to Achieve Goals: Good Progress towards PT goals: Progressing toward goals    Frequency    Min 3X/week      PT Plan Current plan remains appropriate    Co-evaluation              AM-PAC PT "6 Clicks" Daily Activity  Outcome Measure  Difficulty turning over in bed (including adjusting bedclothes, sheets and blankets)?: Unable Difficulty moving from lying on back to sitting on the side of the bed? : Unable Difficulty sitting down on and standing up from a chair with arms (e.g., wheelchair, bedside commode, etc,.)?: Unable Help needed moving to and from a bed to chair (including a wheelchair)?: A Little Help needed walking in hospital room?: A Little Help needed climbing 3-5 steps with a railing? : A Little 6 Click Score: 12    End of Session Equipment Utilized During Treatment: Gait belt;Oxygen Activity Tolerance: Patient tolerated treatment well Patient left: in bed;with bed alarm set;with call bell/phone within reach   PT Visit Diagnosis: Unsteadiness on feet (R26.81);Muscle weakness (generalized) (M62.81)     Time: 4462-8638 PT Time Calculation (min) (ACUTE ONLY): 29 min  Charges:  $Gait Training: 8-22 mins $Self Care/Home Management: 8-22                    G Codes:  Functional Assessment Tool Used: AM-PAC 6 Clicks Basic Mobility;Clinical judgement    Deniece Ree PT, DPT, CBIS  Supplemental Physical Therapist [place]   Pager [phone]

## 2019-01-04 IMAGING — CT CT MAXILLOFACIAL W/O CM
3 series · 15 of 47 positions shown, 18 images · non-contrast
Comparison: Head CT performed today.

CLINICAL DATA: Fall.  Right face lacerations.

EXAM:
CT MAXILLOFACIAL WITHOUT CONTRAST
TECHNIQUE: Multidetector CT imaging of the maxillofacial structures was
performed. Multiplanar CT image reconstructions were also generated.

[Series 3: facialbone 2.0 st · axial · 0.40mm/px · z∈[-264,-102]mm · 9 of 95 slices shown, 12 images]
[im 7/95  brain]
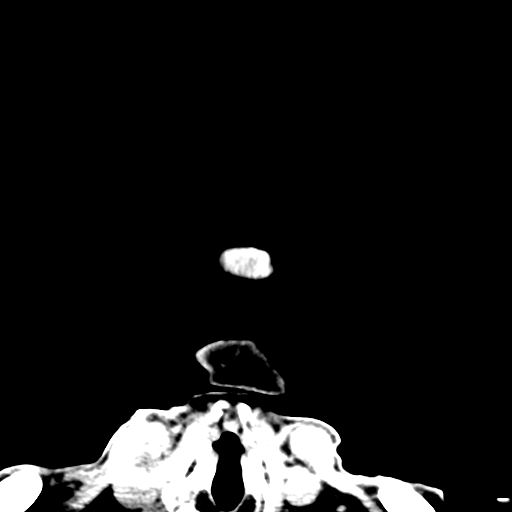
[im 7/95  bone]
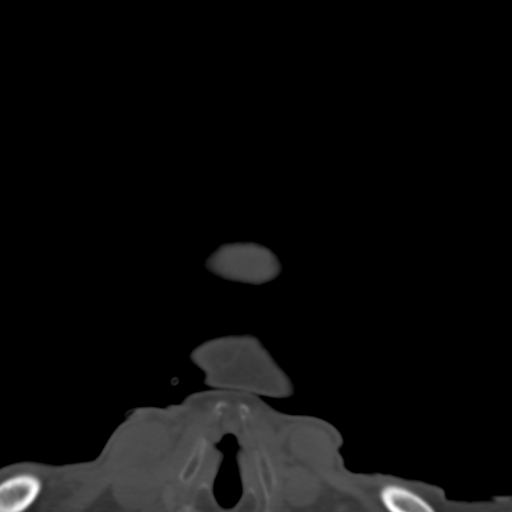
[im 17/95  bone]
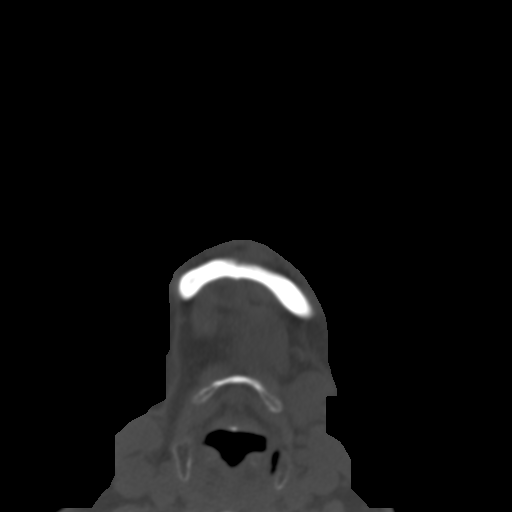
[im 26/95  bone]
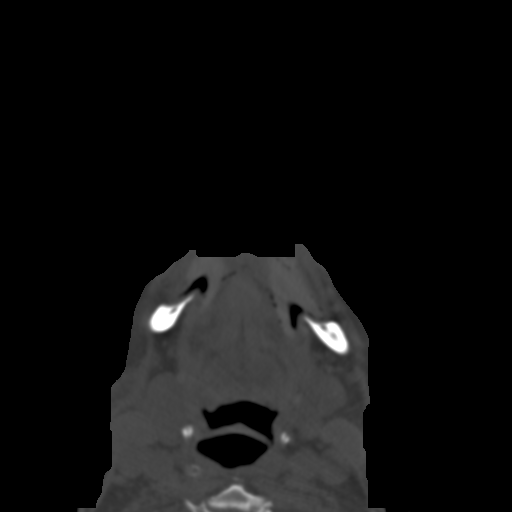
[im 36/95  bone]
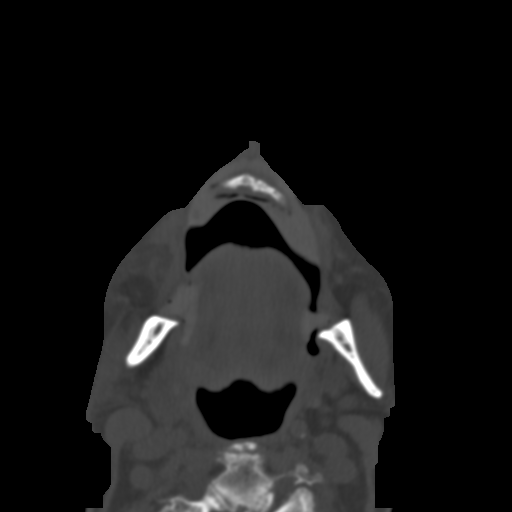
[im 49/95  brain]
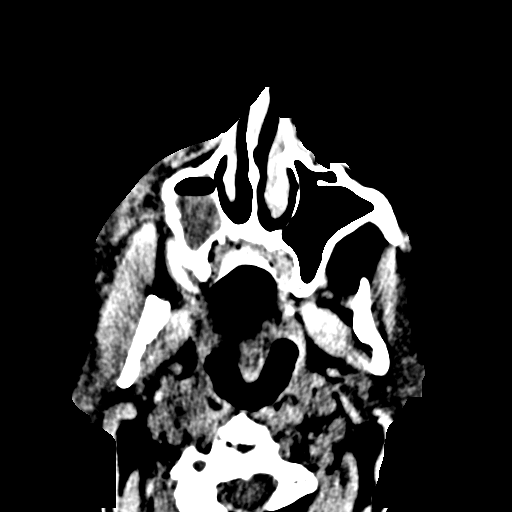
[im 49/95  bone]
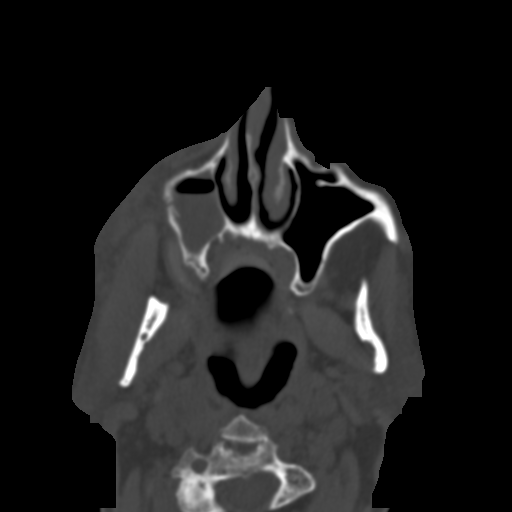
[im 59/95  bone]
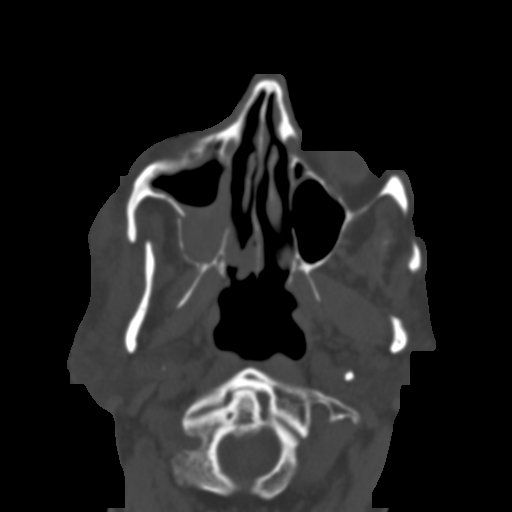
[im 69/95  bone]
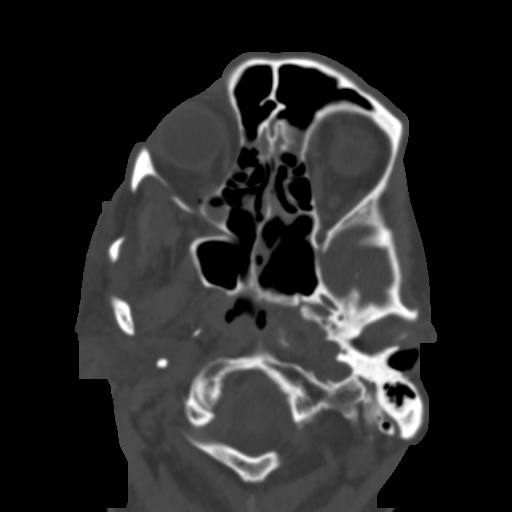
[im 78/95  bone]
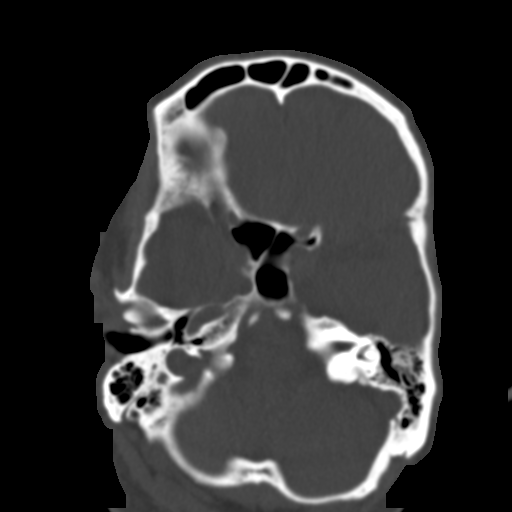
[im 88/95  brain]
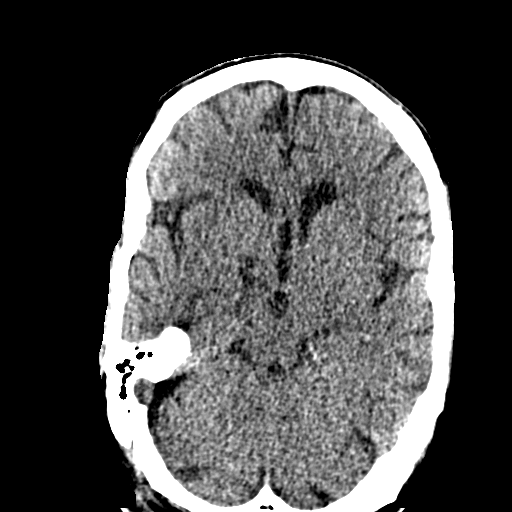
[im 88/95  bone]
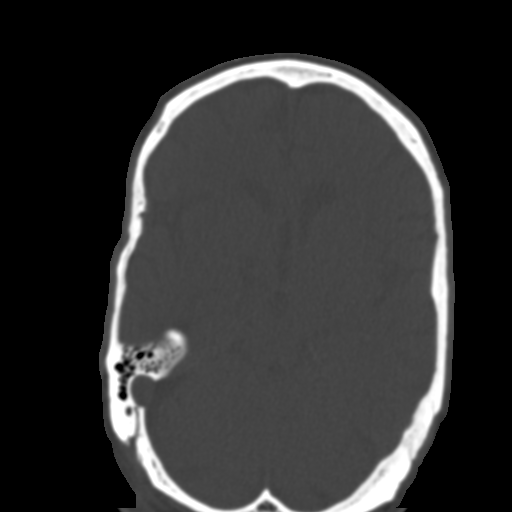

[Series 7: facialbone 2.0 cor st · coronal · 0.42mm/px · 3 of 89 slices shown]
[im 30/89  bone]
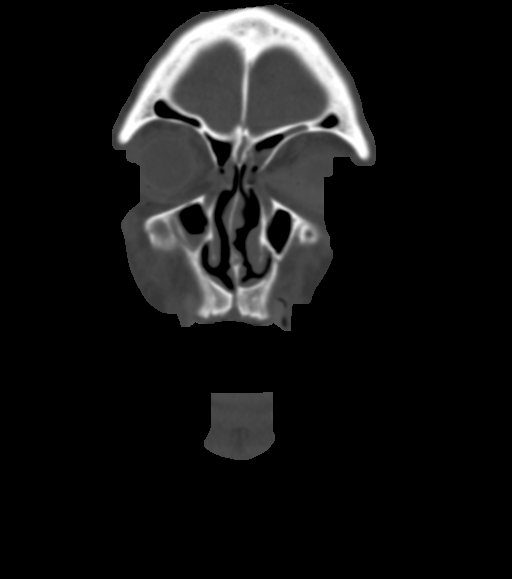
[im 40/89  bone]
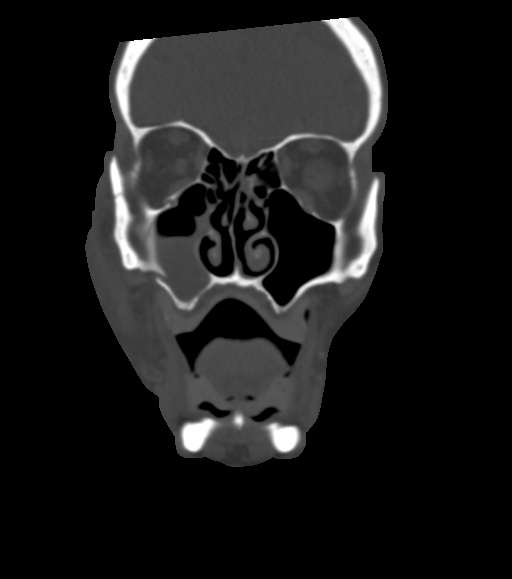
[im 49/89  bone]
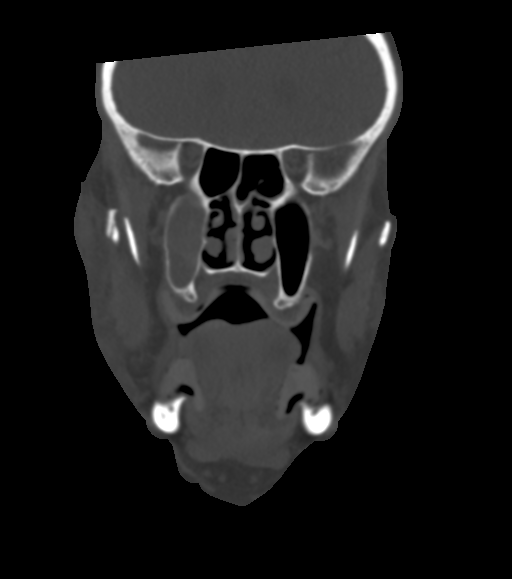

[Series 8: facialbone 2.0 sag st · sagittal · 0.41mm/px · 3 of 101 slices shown]
[im 34/101  bone]
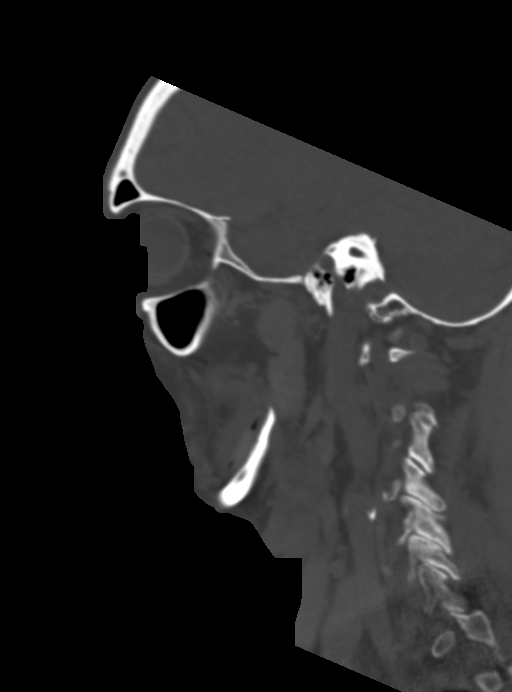
[im 51/101  bone]
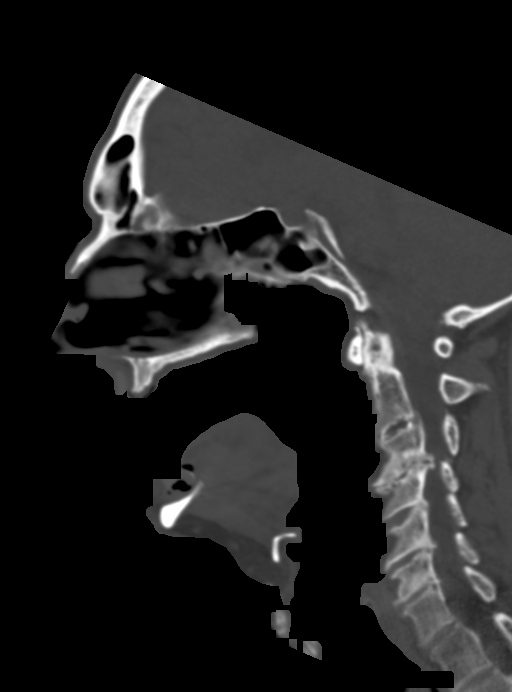
[im 67/101  bone]
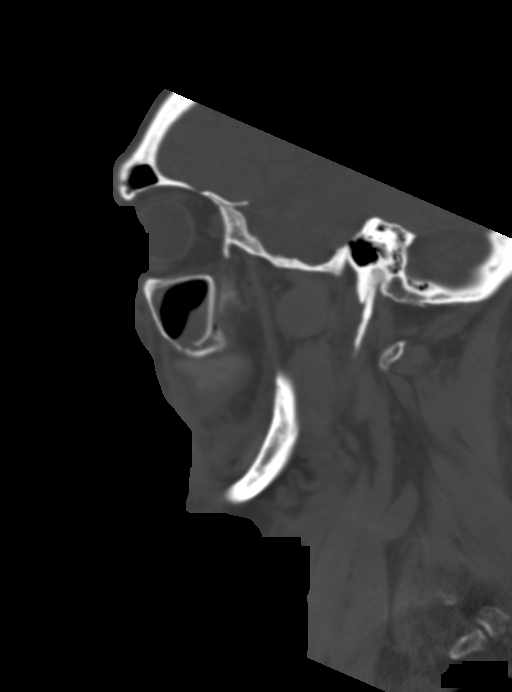

[15 of 47 positions shown; findings below may reference images not displayed]

FINDINGS: Osseous: Fracture through the right zygomatic arch, lateral wall of
the right orbit, and orbital floor. No evidence of entrapment or
herniation of orbital contents into the right maxillary sinus.
Fractures through the anterior and lateral walls of the right
maxillary sinus. No mandibular fracture.

Orbits: Right tripod fracture as described above. Soft tissue
swelling anterior to the right orbit. No intraorbital hematoma.
Globe is intact.

Sinuses: Layering blood in the right maxillary sinus. Opacification
of scattered ethmoid air cells.

Soft tissues: Soft tissue swelling over the right face.

Limited intracranial: No acute findings
IMPRESSION: Right tripod fracture. Fractures through the anterior and lateral
wall of the right maxillary sinus. No evidence of entrapment. Blood
within the right maxillary sinus.

## 2021-07-29 DEATH — deceased
